# Patient Record
Sex: Female | Born: 1990 | Race: White | Marital: Single | Smoking: Never smoker
Health system: Northeastern US, Community
[De-identification: ages and names within clinical notes are randomized; demographics above are authoritative.]

## PROBLEM LIST (undated history)

## (undated) DIAGNOSIS — E282 Polycystic ovarian syndrome: Secondary | ICD-10-CM

## (undated) DIAGNOSIS — R51 Headache: Secondary | ICD-10-CM

## (undated) DIAGNOSIS — R519 Headache, unspecified: Secondary | ICD-10-CM

## (undated) DIAGNOSIS — J329 Chronic sinusitis, unspecified: Secondary | ICD-10-CM

## (undated) DIAGNOSIS — E739 Lactose intolerance, unspecified: Secondary | ICD-10-CM

## (undated) HISTORY — DX: Polycystic ovarian syndrome: E28.2

## (undated) HISTORY — DX: Headache, unspecified: R51.9

## (undated) HISTORY — DX: Lactose intolerance, unspecified: E73.9

## (undated) HISTORY — DX: Headache: R51

## (undated) HISTORY — PX: NO SIGNIFICANT SURGICAL HISTORY: 1000005

## (undated) HISTORY — DX: Chronic sinusitis, unspecified: J32.9

---

## 2017-07-20 ENCOUNTER — Encounter (HOSPITAL_BASED_OUTPATIENT_CLINIC_OR_DEPARTMENT_OTHER): Payer: Self-pay | Admitting: Family Medicine

## 2017-07-20 ENCOUNTER — Ambulatory Visit (HOSPITAL_BASED_OUTPATIENT_CLINIC_OR_DEPARTMENT_OTHER): Payer: Medicaid Other | Admitting: Family Medicine

## 2017-07-20 VITALS — BP 108/67 | HR 77 | Temp 98.2°F | Ht 65.75 in | Wt 112.0 lb

## 2017-07-20 DIAGNOSIS — E739 Lactose intolerance, unspecified: Secondary | ICD-10-CM | POA: Insufficient documentation

## 2017-07-20 DIAGNOSIS — K219 Gastro-esophageal reflux disease without esophagitis: Principal | ICD-10-CM | POA: Insufficient documentation

## 2017-07-20 DIAGNOSIS — Z7189 Other specified counseling: Secondary | ICD-10-CM

## 2017-07-20 DIAGNOSIS — J069 Acute upper respiratory infection, unspecified: Secondary | ICD-10-CM

## 2017-07-20 MED ORDER — RANITIDINE HCL 150 MG PO TABS
150.00 mg | ORAL_TABLET | Freq: Every evening | ORAL | 3 refills | Status: AC | PRN
Start: 2017-07-20 — End: 2018-07-20

## 2017-07-20 MED ORDER — RANITIDINE HCL 150 MG PO TABS: 150 mg | tablet | Freq: Every evening | ORAL | 3 refills | 0 days | Status: AC | PRN

## 2017-07-20 NOTE — Progress Notes (Signed)
[facility]  Family Medicine Office Visit Note   Patient presents with:  Establish Care  Referral: GI and GYN    Subjective:   Danielle Pittman is a 26 year old female patient who presents today for new pt visit.    Lactose intolerance x3 years. Needs a GI doctor. They were wondering in [place] if she had IBS and reflux.  Triggers: coffee, nuts, dried fruit, cookies, tomato, pineapple.  Removed dairy and had a plateau of weight.  Noting burning/heartburn and indigestion with a full sensation.  Hx of pantoprazole x3 months with relief in the past  Denies intractable nausea or vomiting and is able to tolerate PO  Denies weight loss (unintentional/unexplained)  Denies melena/hematochezia  Denies history of anemia  Denies family history of bowel/ovarian cancer/IBD/celiac     Wants to see routine OB/GYN for her routine physical. Found out that sister had abnormal pap smear. And wants to be checked  Hx of 2 pap smears in the past, both normal.    Past Medical History:  No date: Headache  No date: Lactose intolerance  No date: Sinusitis  Past Surgical History:  No date: NO SIGNIFICANT SURGICAL HISTORY    No current outpatient prescriptions on file prior to visit.  No current facility-administered medications on file prior to visit.   Review of Patient's Allergies indicates:  No Known Allergies    Family History    Cancer - Other Mother     Comment: skin cancer x3    Alcohol/Drug Abuse Father     Cancer - Other Other     Comment: great grandmother w/ skin, laryngeal cancer that metastasized to the lungs    Cancer - Breast FamHxNeg     Cancer - Colon FamHxNeg     Diabetes FamHxNeg         Social History  Social History   Marital status: Single  Spouse name: N/A    Years of education: N/A  Number of children: N/A     Occupational History  None on file     Social History Main Topics   Smoking status: Never Smoker    Smokeless tobacco: Never Used    Alcohol use No    Drug use: No    Sexual activity: Yes     Partners: Male    Birth control/ protection: Condom     Other Topics Concern   None on file     Social History Narrative    From [place]. Bachelor's in Nurse, children's. Moved to [place] in 2017, learning english, working as a Solicitor for a shop. Lives w/ sister. Enjoys netflix/walking around in the parks.    07/20/2017 Fiance is in [place]. Interested in joining him in two years      Review of Systems - ROS all systems reviewed and are otherwise negative.       Objective:   BP 108/67  Pulse 77  Temp 98.2 F (36.8 C) (Temporal)  Ht 5' 5.75" (1.67 m)  Wt 50.8 kg (112 lb)  LMP 07/10/2017  SpO2 98%  BMI 18.22 kg/m2  GA: [place] woman in no apparent distress   Skin: no evident rashes/lesions  HEENT: Normocephalic/atraumatic. Mucous membranes are moist. EOMI. Oropharynx clear.  Neck: supple, no lymphadenopathy. No thyromegaly.  Lung: clear to auscultation bilaterally, no crackles/wheezes/rhonchi  CV: regular rate and rhythm, no murmurs/rubs/gallops   ABD: soft, non-tender. normoactive bowel sounds. No organomegaly.   Extrem: warm, well perfused, 2+radial pulses. No cyanosis/clubbing/edema  Assessment & Plan:   Danielle Pittman is a 26 year old female    (K21.9) Gastroesophageal reflux disease, esophagitis presence not specified  (primary encounter diagnosis)  GERD diet discussed.  Pt interested in discontinuing PPI  Plan: raNITIdine (ZANTAC) 150 MG tablet Qhs prn severe sx.    (E73.9) Lactose intolerance  Comment: deferring GI referral today; reviewed red flags for GI sx as above as reasons to return to care.    (Z71.89) Advance care planning  Plan: HEALTH CARE PROXY    (J06.9) Upper respiratory tract infection, with headache  Comment: pt interested in using OTC saline sinus rinses for now.     I have reviewed the past medical, surgical, social and family history and updated these sections of EpicCare as relevant. All interim labs, test results, and consult notes were reviewed and discussed with Danielle Pittman. Medications were  reconciled during this visit and a current medication list was given to the patient at the end of the visit.    Danielle Pommier K. Kela MillinPong, MD  07/20/2017

## 2017-07-20 NOTE — Patient Instructions (Addendum)
Patient Education   Escolha de alimentos para a doena do refluxo gastroesofgico, adultos  (Food Choices for Gastroesophageal Reflux Disease, Adult)  Quando voc tem a doena do refluxo gastroesofgico (DRGE), os alimentos que ingere e seus hbitos alimentares so muito importantes. Escolher os alimentos certos pode ajudar a Lawyer.  QUAIS SO AS ORIENTAES GERAIS QUE DEVO SEGUIR?   Escolha frutas, vegetais, gros integrais, laticnios com baixo teor de gordura e carne, peixe e aves com pouca gordura.   Limite a ingesto de [employer], molhos para Gy[place], [place], nozes e abacate.   Faa um dirio alimentar para identificar os alimentos que causam os sintomas.   Evite alimentos que provoquem refluxo. Eles podem ser diferentes para pessoas distintas.   Faa refeies pequenas e frequentes em vez de trs grandes refeies ao dia.   Alimente-se com calma, em um ambiente tranquilo.   Limite os alimentos fritos.   Cozinhe os alimentos usando mtodos que no sejam a fritura.   Evite ingerir bebidas alcolicas.   Evite beber grandes quantidades de lquido com suas refeies.   Evite inclinar-se ou deitar at 2 a 3 horas aps comer.    QUAIS SO OS ALIMENTOS NO RECOMENDADOS?  Os seguintes alimentos e bebidas podem agravar os sintomas:  Legumes e verduras  Tomates. Suco de [place]. Molho de tomate e espaguete. Pimentas chili. [place] e alho. Raiz-forte.  Nils Pyle  Laranja, toranja e limo (fruta e suco).  Carnes  Carnes, peixes e aves com [place]. Elas incluem salsichas, costelas, presunto, salame e bacon.  Produtos lcteos  Leite integra e leite com chocolate. Creme de leite. Cremes. Manteiga. Sorvetes. Queijo cremoso.  Bebidas  Caf e ch, com ou sem cafeina. Bebidas carbonatadas ou bebidas energticas.  Condimentos  Molho apimentado. Molho barbecue.  Doces/Sobremesas  Chocolate e cacau. Donuts. Menta e hortel.  Gorduras e leos  Alimentos com alto teor de gordura,  incluindo batata frita e chips.  Outros  Vinagre. Temperos fortes como pimenta do reino, pimenta branca, pimenta vermelha, pimenta-caiena, curry, cravos, gengibre e chili em p.  Os itens listados acima podem no ser [facility] alimentos e bebidas a evitar. Entre em contato com seu nutricionista para mais informaes.  Estas informaes no se destinam a substituir as recomendaes de seu mdico. No deixe de discutir quaisquer dvidas com seu mdico.  Document Released: 11/02/2005 Document Revised: 11/23/2014 Document Reviewed: 09/06/2013  Elsevier Interactive Patient Education  2017 Elsevier Inc.       Patient Education   Escolha de alimentos para a doena do refluxo gastroesofgico, adultos  (Food Choices for Gastroesophageal Reflux Disease, Adult)  Careers adviser voc tem a doena do refluxo gastroesofgico (DRGE), os alimentos que ingere e seus hbitos alimentares so muito importantes. Escolher os alimentos certos pode ajudar a Lawyer.  QUAIS SO AS ORIENTAES GERAIS QUE DEVO SEGUIR?   Escolha frutas, vegetais, gros integrais, laticnios com baixo teor de gordura e carne, peixe e aves com pouca gordura.   Limite a ingesto de [employer], molhos para [place], [place], nozes e abacate.   Faa um dirio alimentar para identificar os alimentos que causam os sintomas.   Evite alimentos que provoquem refluxo. Eles podem ser diferentes para pessoas distintas.   Faa refeies pequenas e frequentes em vez de trs grandes refeies ao dia.   Alimente-se com calma, em um ambiente tranquilo.   Limite os alimentos fritos.   Cozinhe os alimentos usando mtodos que no sejam a  fritura.   Evite ingerir bebidas alcolicas.   Evite beber grandes quantidades de lquido com suas refeies.   Evite inclinar-se ou deitar at 2 a 3 horas aps comer.    QUAIS SO OS ALIMENTOS NO RECOMENDADOS?  Os seguintes alimentos e bebidas podem agravar os sintomas:  Legumes e  verduras  Tomates. Suco de [place]. Molho de tomate e espaguete. Pimentas chili. [place] e alho. Raiz-forte.  Nils PyleFrutas  Laranja, toranja e limo (fruta e suco).  Carnes  Carnes, peixes e aves com [place] gordura. Elas incluem salsichas, costelas, presunto, salame e bacon.  Produtos lcteos  Leite integra e leite com chocolate. Creme de leite. Cremes. Manteiga. Sorvetes. Queijo cremoso.  Bebidas  Caf e ch, com ou sem cafeina. Bebidas carbonatadas ou bebidas energticas.  Condimentos  Molho apimentado. Molho barbecue.  Doces/Sobremesas  Chocolate e cacau. Donuts. Menta e hortel.  Gorduras e leos  Alimentos com alto teor de gordura, incluindo batata frita e chips.  Outros  Vinagre. Temperos fortes como pimenta do reino, pimenta branca, pimenta vermelha, pimenta-caiena, curry, cravos, gengibre e chili em p.  Os itens listados acima podem no ser [place] lista completa dos alimentos e bebidas a evitar. Entre em contato com seu nutricionista para mais informaes.  Estas informaes no se destinam a substituir as recomendaes de seu mdico. No deixe de discutir quaisquer dvidas com seu mdico.  Document Released: 11/02/2005 Document Revised: 11/23/2014 Document Reviewed: 09/06/2013  Elsevier Interactive Patient Education  2017 [employer]

## 2017-07-21 ENCOUNTER — Encounter (HOSPITAL_BASED_OUTPATIENT_CLINIC_OR_DEPARTMENT_OTHER): Payer: Self-pay | Admitting: Family Medicine

## 2017-08-31 ENCOUNTER — Telehealth (HOSPITAL_BASED_OUTPATIENT_CLINIC_OR_DEPARTMENT_OTHER): Payer: Self-pay | Admitting: Family Medicine

## 2017-08-31 NOTE — Telephone Encounter (Signed)
-----   Message from Nedrow D. Lemus sent at 08/31/2017 12:08 PM EDT -----  Regarding: benefit review  Vaccine Benefit Analysis:  Needed for Tdap and HPV vaccine(s).  Is patient currently waiting in clinic? No - Appt Date: 10/18    Route to the nursing pool?  Yes

## 2017-08-31 NOTE — Progress Notes (Unsigned)
Betsy from [facility] called the Central Refill Department to complete a benefit analysis for the tdap, hpv Vaccine.    The vaccine is covered under the patients prescription coverage.    Please choose [id number] tdap (Prior Auth/Pharmacy)     Please choose [phone] hpv (Prior Auth/Pharmacy)

## 2017-09-02 ENCOUNTER — Encounter (HOSPITAL_BASED_OUTPATIENT_CLINIC_OR_DEPARTMENT_OTHER): Payer: Self-pay | Admitting: Family Medicine

## 2017-09-02 ENCOUNTER — Ambulatory Visit (HOSPITAL_BASED_OUTPATIENT_CLINIC_OR_DEPARTMENT_OTHER): Payer: Medicaid Other | Admitting: Family Medicine

## 2017-09-02 VITALS — BP 102/68 | HR 62 | Temp 99.0°F | Ht 63.09 in | Wt 108.0 lb

## 2017-09-02 DIAGNOSIS — Z86010 Personal history of colonic polyps: Secondary | ICD-10-CM

## 2017-09-02 DIAGNOSIS — Z23 Encounter for immunization: Secondary | ICD-10-CM

## 2017-09-02 DIAGNOSIS — E282 Polycystic ovarian syndrome: Secondary | ICD-10-CM | POA: Insufficient documentation

## 2017-09-02 DIAGNOSIS — R1013 Epigastric pain: Secondary | ICD-10-CM

## 2017-09-02 DIAGNOSIS — Z9889 Other specified postprocedural states: Secondary | ICD-10-CM

## 2017-09-02 DIAGNOSIS — K120 Recurrent oral aphthae: Principal | ICD-10-CM

## 2017-09-02 LAB — CBC WITH PLATELET
HEMATOCRIT: 40.3 % (ref 34.1–44.9)
HEMOGLOBIN: 13.5 g/dL (ref 11.2–15.7)
MEAN CORP HGB CONC: 33.5 g/dL (ref 31.0–37.0)
MEAN CORPUSCULAR HGB: 29 pg (ref 26.0–34.0)
MEAN CORPUSCULAR VOL: 86.5 fL (ref 80.0–100.0)
MEAN PLATELET VOLUME: 11.5 fL (ref 8.7–12.5)
PLATELET COUNT: 252 10*3/uL (ref 150–400)
RBC DISTRIBUTION WIDTH STD DEV: 41.1 fL (ref 35.1–46.3)
RBC DISTRIBUTION WIDTH: 13.2 % (ref 11.5–14.3)
RED BLOOD CELL COUNT: 4.66 M/uL (ref 3.90–5.20)
WHITE BLOOD CELL COUNT: 7.9 10*3/uL (ref 4.0–11.0)

## 2017-09-02 MED ORDER — BENZOCAINE 10 % MT GEL
OROMUCOSAL | 1 refills | Status: AC | PRN
Start: 2017-09-02 — End: 2018-03-03

## 2017-09-02 MED ORDER — BENZOCAINE 10 % MT GEL: tube | OROMUCOSAL | 1 refills | 0 days | Status: AC | PRN

## 2017-09-02 MED FILL — GARDASIL 9 INJ: 1 days supply | Qty: 1 | Fill #0 | Status: CP

## 2017-09-02 NOTE — Progress Notes (Signed)
[facility]  Family Medicine Office Visit Note   Patient presents with:  Physical    Subjective:   Danielle Pittman is a 26 year old female patient who presents today for PE  Here with her mother - 'not eating well, last week had sores in the mouth' hx of aphthous/canker sores Q2 weeks  Stomach upset with heartburn, halitosis, and bloating -- cut out dairy and cut out gas-producing foods but these sensations are continuing  Taking baking soda without much relief.  No meds.  (+)heavy nausea  No vomiting -- if she has milk or trigger foods. Worsening in the past three weeks  4# wt loss in the past month  Bloating all the time;  No mucus in stools.  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   Review of Patient's Allergies indicates:  No Known Allergies  Family History   Problem Relation Age of Onset    Cancer - Other Mother         skin cancer x3    Alcohol/Drug Abuse Father     Cancer - Other Other         great grandmother w/ skin, laryngeal cancer that metastasized to the lungs    Cancer - Breast FamHxNeg     Cancer - Colon FamHxNeg     Diabetes FamHxNeg       Social History     Socioeconomic History    Marital status: Single     Spouse name: Not on file    Number of children: Not on file    Years of education: Not on file    Highest education level: Not on file   Social Needs    Financial resource strain: Not on file    Food insecurity - worry: Not on file    Food  insecurity - inability: Not on file    Transportation needs - medical: Not on file    Transportation needs - non-medical: Not on file   Occupational History    Not on file   Tobacco Use    Smoking status: Never Smoker    Smokeless tobacco: Never Used   Substance and Sexual Activity    Alcohol use: No    Drug use: No    Sexual activity: Yes     Partners: Male     Birth control/protection: Condom   Other Topics Concern    Not on file   Social History Narrative    From [place]. Bachelor's in Nurse, children'sconomics. 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 Enjoys netflix/walking around in the parks.    07/20/2017 F[place] is in [place]. Interested in joining him in two years      Review of Systems - ROS all systems reviewed and are otherwise negative.         Objective:   BP 102/68 (Site:  RA, Position: Sitting, Cuff Size: Reg)  Pulse 62  Temp 99 F (37.2 C) (Temporal)  Ht 5' 3.09" (1.602 m)  Wt 49 kg (108 lb)  LMP 09/02/2017  SpO2 100%  BMI 19.08 kg/m2   Most Recent Weight Reading(s)  09/02/17 : 49 kg (108 lb)  07/20/17 : 50.8 kg (112 lb)  GA: Pleasant [place] woman in no apparent distress   Skin: no evident rashes/lesions  HEENT:. Normocephalic/atraumatic. Sclera white, conjunctiva pink.  PERRL. EOMI. Mucous membranes are moist.   Neck: supple  Lung: clear to auscultation bilaterally, no crackles/wheezes/rhonchi  CV: regular rate and rhythm, no murmurs/rubs/gallops   ABD: soft, mild/moderate tendemess in epigastric/RUQ region. normoactive bowel sounds. No organomegaly.  No rebound/guarding/negative Murphy's sign  Extrem: warm, well perfused, 2+radial pulses . No cyanosis/clubbing/edema          Assessment & Plan:   Danielle Pittman is a 26 year old female    (K12.0) Canker sore  Will prescribe oragel for canker sores  Plan: CBC WITH PLATELET, COLLECTION VENOUS BLOOD         VENIPUNCTURE, COMPREHENSIVE METABOLIC PANEL,         REFERRAL TO GASTROENTEROLOGY ( INT)    (R10.13)  Dyspepsia  R/o PUD/hpylori, schisto/strongy, IBD. Less likely gallbladder colic.  Given canker sores and hx of colonoscopy at early age   Plan: HELICOBACTER PYLORI STOOL AG, STRONGYLOIDES AB         IGG, SCHISTOSOMAL AB IGG, CBC WITH PLATELET,         CELIAC PANEL, COLLECTION VENOUS BLOOD         VENIPUNCTURE, COMPREHENSIVE METABOLIC PANEL,         REFERRAL TO GASTROENTEROLOGY ( INT)    (Z98.890,  Z86.010) History of colonoscopy with polypectomy  Plan: CBC WITH PLATELET, COMPREHENSIVE METABOLIC         PANEL, REFERRAL TO GASTROENTEROLOGY ( INT)    (E28.2) PCOS (polycystic ovarian syndrome)  Comment: discussed that her PCOS likely resolved, dx'ed by [place] as a teenager. Has normal periods normal BMI and normal hair pattern growth.    (Z23) Need for HPV vaccination  Plan: HPV-9 IM (PRIOR AUTH NEEDED)  (Z23) Need for prophylactic vaccination and inoculation against influenza  Plan: IMMUNIZATION ADMIN SINGLE, RN, PR INFLUENZA VAC      QUAD 3 YRS PLUS IM (PUBLIC)    I have reviewed the past medical, surgical, social and family history and updated these sections of EpicCare as relevant. All interim labs, test results, and consult notes were reviewed and discussed with Danielle Pittman. Medications were reconciled during this visit and a current medication list was given to the patient at the end of the visit.    Danielle Pittman K. Kela Millin, MD  09/02/2017

## 2017-09-02 NOTE — Progress Notes (Signed)
Influenza Vaccine Procedure  September 02, 2017    1. Has the patient received the information for the influenza vaccine? Yes    2. Does the patient have any of the following contraindications?  Allergy to eggs? No  Allergic reaction to previous influenza vaccines? No  Any other problems to previous influenza vaccines? No  Paralyzed by Guillain-Barre syndrome?  No  Current moderate or severe illness? No  Allergy to contact lens solution? No    3. The vaccine has been administered in the usual fashion.     Immunization information reviewed. Current VIS reviewed and given to patient/ guardian. Verbal assent obtained from patient/ guardian.  See immunization/Injection module or chart review for date of publication and additional information. Verbal assent obtained from patient/guardian. Comfort measures for possible side effects reviewed.

## 2017-09-03 LAB — CELIAC PANEL
DEAMIDATED GLIADIN PEPTIDE IGA: <0.2 U/mL (ref 0–14.9)
TISSUE TRANSGLUTAMINASE IgA: <0.5 U/mL (ref 0–14.9)

## 2017-09-04 LAB — COMPREHENSIVE METABOLIC PANEL
ALANINE AMINOTRANSFERASE: 23 U/L (ref 12–45)
ALBUMIN: 4.1 g/dL (ref 3.4–5.0)
ALKALINE PHOSPHATASE: 74 U/L (ref 45–117)
ANION GAP: 5 mmol/L (ref 5–15)
ASPARTATE AMINOTRANSFERASE: 19 U/L (ref 8–34)
BILIRUBIN TOTAL: 0.5 mg/dL (ref 0.2–1.0)
BUN (UREA NITROGEN): 13 mg/dL (ref 7–18)
CALCIUM: 8.9 mg/dL (ref 8.5–10.1)
CARBON DIOXIDE: 30 mmol/L (ref 21–32)
CHLORIDE: 106 mmol/L (ref 98–107)
CREATININE: 0.8 mg/dL (ref 0.4–1.2)
ESTIMATED GLOMERULAR FILT RATE: >60 mL/min (ref >60)
Glucose Random: 80 mg/dL (ref 74–160)
POTASSIUM: 4 mmol/L (ref 3.5–5.1)
SODIUM: 141 mmol/L (ref 136–145)
TOTAL PROTEIN: 7.5 g/dL (ref 6.4–8.2)

## 2017-09-04 LAB — STRONGYLOIDES AB IGG: STRONGYLOIDES AB IgG: 3.72 INDEX (ref 0.00–9.99)

## 2017-09-04 LAB — SCHISTOSOMAL AB IGG: SCHISTOSOMAL AB IgG: 12.85 INDEX — ABNORMAL HIGH (ref 0.00–8.99)

## 2017-09-06 ENCOUNTER — Telehealth (HOSPITAL_BASED_OUTPATIENT_CLINIC_OR_DEPARTMENT_OTHER): Payer: Self-pay | Admitting: Registered Nurse

## 2017-09-06 ENCOUNTER — Telehealth (HOSPITAL_BASED_OUTPATIENT_CLINIC_OR_DEPARTMENT_OTHER): Payer: Self-pay | Admitting: Family Medicine

## 2017-09-06 ENCOUNTER — Ambulatory Visit (HOSPITAL_BASED_OUTPATIENT_CLINIC_OR_DEPARTMENT_OTHER): Payer: Medicaid Other | Admitting: Family Medicine

## 2017-09-06 MED ORDER — PRAZIQUANTEL 600 MG PO TABS: 20 mg/kg | tablet | Freq: Three times a day (TID) | ORAL | 0 refills | 0 days | Status: AC

## 2017-09-06 MED ORDER — PRAZIQUANTEL 600 MG PO TABS
20.00 mg/kg | ORAL_TABLET | Freq: Three times a day (TID) | ORAL | 0 refills | Status: AC
Start: 2017-09-06 — End: 2017-09-07

## 2017-09-06 MED FILL — BILTRICIDE 600MG: 1 days supply | Qty: 5 | Fill #0 | Status: CP

## 2017-09-06 MED FILL — RANITIDINE 150MG TAB: 30 days supply | Qty: 30 | Fill #0 | Status: CP

## 2017-09-06 NOTE — Progress Notes (Signed)
Results phone-call:    Telephone Information:   Home Phone [phone]   Work Phone Not on file.   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  Awaiting stool H pylori testing as well -- she was waiting to see if it would be okay to stool at home and bring in the result tomorrow. I reassured her that it would be okay to do that.    Brief chart review:  Patient Active Problem List    PCOS (polycystic ovarian syndrome)      Gastroesophageal reflux disease         Date Noted: 07/20/2017      Lactose intolerance         Current Outpatient Medications on File Prior to Visit:  benzocaine (ORAJEL) 10 % mucosal gel Use as directed in the mouth or throat as needed for Pain Disp: 2 tube Rfl: 1   raNITIdine (ZANTAC) 150 MG tablet Take 1 tablet by mouth nightly as needed for Heartburn Disp: 90 tablet Rfl: 3     No current facility-administered medications on file prior to visit.    Review of Patient's Allergies indicates:  No Known Allergies   No results found for this visit on 09/06/17 (from the past 24 hour(s)).    Office Visit on 09/02/2017   Component Date Value Ref Range Status    STRONGYLOIDES AB IgG 09/02/2017 3.72  0.00 - 9.99 INDEX Final    Comment:  < = 9.99:  Negative      10.00:  Equivocal  > = 10.01:  Positive    If the result is equivocal, it is recommended to repeat the  test again 2-4 weeks. If results of the second test are  again equivocal, the sample has to be considered negative.    This ELISA uses the structural similarities existing between  S. stercoralis and S. ratti antigens to detect antibodies in  human sera.    The diagnostic specificity is defined as the probability of  the assay of scoring negative in the absence of the specific  analyte. It is 94 %.    The diagnostic sensitivity is defined as the probability of  the assay of scoring positive in the presence of the  specific analyte.  It is 88 %.    Potential causes of a false positive are Amebiasis,  Ascaridiosis, Cysticercosis, Fasciolosis,  Filariosis,  Cystic echinococcosis, Schistosomosis and Toxocarosis.      SCHISTOSOMAL AB IgG 09/02/2017 12.85* 0.00 - 8.99 INDEX Final    Comment:     < = 8.99:   Negative  9.00 - 11.00:   Equivocal     > = 11.01:   Positive    If the result is equivocal, it is recommended to repeat the  test again in 2 -4 weeks. If results of the second test are  again equivocal, the sample should be considered negative.    The diagnostic specificity is defined as the probability of  the assay of scoring negative in the absence of the specific  analyte. It is >95 %.    The diagnostic sensitivity is defined as the probability of  the assay of scoring positive in the presence of the  specific analyte. It is 87 %.      WHITE BLOOD CELL COUNT 09/02/2017 7.9  4.0 - 11.0 TH/uL Final    RED  BLOOD CELL COUNT 09/02/2017 4.66  3.90 - 5.20 M/uL Final    HEMOGLOBIN 09/02/2017 13.5  11.2 - 15.7 g/dL Final    HEMATOCRIT 16/08/9603 40.3  34.1 - 44.9 % Final    MEAN CORPUSCULAR VOL 09/02/2017 86.5  80.0 - 100.0 fL Final    MEAN CORPUSCULAR HGB 09/02/2017 29.0  26.0 - 34.0 pg Final    MEAN CORP HGB CONC 09/02/2017 33.5  31.0 - 37.0 g/dL Final    RBC DISTRIBUTION WIDTH STD DEV 09/02/2017 41.1  35.1 - 46.3 fL Final    RBC DISTRIBUTION WIDTH 09/02/2017 13.2  11.5 - 14.3 % Final    PLATELET COUNT 09/02/2017 252  150 - 400 TH/uL Final    MEAN PLATELET VOLUME 09/02/2017 11.5  8.7 - 12.5 fL Final    TISSUE TRANSGLUTAMINASE IgG 09/02/2017 TNP  0 - 14.9 U/mL Final    DEAMINATED GLIADIN PEPTIDE IgG 09/02/2017 TNP  0 - 14.9 U/mL Final    TISSUE TRANSGLUTAMINASE IgA 09/02/2017 < 0.5  0 - 14.9 U/mL Final    DEAMIDATED GLIADIN PEPTIDE IgA 09/02/2017 < 0.2  0 - 14.9 U/mL Final    SODIUM 09/02/2017 141  136 - 145 mmol/L Final    POTASSIUM 09/02/2017 4.0  3.5 - 5.1 mmol/L Final    CHLORIDE 09/02/2017 106  98 - 107 mmol/L Final    CARBON  DIOXIDE 09/02/2017 30  21 - 32 mmol/L Final    ANION GAP 09/02/2017 5  5 - 15 mmol/L Final    CALCIUM 09/02/2017 8.9  8.5 - 10.1 mg/dL Final    Glucose Random 09/02/2017 80  74 - 160 mg/dL Final    BUN (UREA NITROGEN) 09/02/2017 13  7 - 18 mg/dL Final    TOTAL PROTEIN 09/02/2017 7.5  6.4 - 8.2 g/dL Final    ALBUMIN 54/07/8118 4.1  3.4 - 5.0 g/dL Final    BILIRUBIN TOTAL 09/02/2017 0.5  0.2 - 1.0 mg/dL Final    ALKALINE PHOSPHATASE 09/02/2017 74  45 - 117 U/L Final    ASPARTATE AMINOTRANSFERASE 09/02/2017 19  8 - 34 U/L Final    CREATININE 09/02/2017 0.8  0.4 - 1.2 mg/dL Final    ESTIMATED GLOMERULAR FILT RATE 09/02/2017 > 60  >60 ML/MIN Final    Comment: The estimated GFR derived from the MDRD Study equation has  not been validated for use with the elderly (over 50 years  of age), pregnant women, patients with serious comorbid  conditions, or persons with extremes of body size, muscle  mass, or nutritional status. Application of the equation to  these patient groups may lead to errors in GFR estimation.      ALANINE AMINOTRANSFERASE 09/02/2017 23  12 - 45 U/L Final

## 2017-09-06 NOTE — Progress Notes (Signed)
Returned call and spoke to pt using portuguese interpreter.    Pt states that she spoke to her PCP earlier and PCP had answered her questions and she is all set now.

## 2017-09-06 NOTE — Telephone Encounter (Signed)
-----  Message from [place] sent at 09/06/2017  2:38 PM EDT -----  Regarding: Stool kit questions  Contact: [phone]  Nallely Yost [phone], 26 year old, female    Calls today:  Clinical Questions (NON-SICK CLINICAL QUESTIONS ONLY)      Specific nature of request pt states she was given a stool kit at her last visit and was not sure when she is supposed to collect the sample. Pt is requesting a call back from nurse to verify.  Return phone number [phone]  Person calling on behalf of patient: Patient (self)      Patient's language of care: [place]    Patient needs a [place] interpreter.    Patient's PCP: Lenoria Chime. Donnita [place], MD, MD

## 2017-09-07 ENCOUNTER — Ambulatory Visit (HOSPITAL_BASED_OUTPATIENT_CLINIC_OR_DEPARTMENT_OTHER): Payer: Medicaid Other

## 2017-09-07 DIAGNOSIS — R1013 Epigastric pain: Principal | ICD-10-CM

## 2017-09-07 NOTE — Progress Notes (Signed)
[name],[place], 09/07/2017, 7:51 PM

## 2017-09-08 LAB — HELICOBACTER PYLORI STOOL AG

## 2017-09-09 ENCOUNTER — Encounter (HOSPITAL_BASED_OUTPATIENT_CLINIC_OR_DEPARTMENT_OTHER): Payer: Self-pay | Admitting: Family Medicine

## 2017-09-27 ENCOUNTER — Ambulatory Visit (HOSPITAL_BASED_OUTPATIENT_CLINIC_OR_DEPARTMENT_OTHER): Payer: Medicaid Other | Admitting: Family Medicine

## 2017-09-27 ENCOUNTER — Encounter (HOSPITAL_BASED_OUTPATIENT_CLINIC_OR_DEPARTMENT_OTHER): Payer: Self-pay | Admitting: Family Medicine

## 2017-09-27 VITALS — BP 96/61 | HR 65 | Temp 98.0°F | Wt 109.8 lb

## 2017-09-27 DIAGNOSIS — Z124 Encounter for screening for malignant neoplasm of cervix: Principal | ICD-10-CM

## 2017-09-27 DIAGNOSIS — Z113 Encounter for screening for infections with a predominantly sexual mode of transmission: Secondary | ICD-10-CM

## 2017-09-27 NOTE — Progress Notes (Signed)
Clinic Note:    SUBJECTIVE:  Danielle Pittman is a 26 year old female with the following active problem list:    Patient Active Problem List:     Lactose intolerance     Gastroesophageal reflux disease     PCOS (polycystic ovarian syndrome)      CC: Danielle Pittman is a 26 year old female who presents for her PAP    1) PAP  - no hx of abnormal  - normal monthly menses  - no vaginal or urinary concerns  - no birth control, uses condoms, not interested in other method at this time    2) STD screen  - would like GC CT screening  - no sx but would like to be safe        Past Medical History:  No date: Headache  No date: Lactose intolerance  No date: PCOS (polycystic ovarian syndrome)      Comment:  tx'ed as a teenager  No date: Sinusitis    Past Surgical History:  No date: NO SIGNIFICANT SURGICAL HISTORY      Current Outpatient Medications on File Prior to Visit:  benzocaine (ORAJEL) 10 % mucosal gel Use as directed in the mouth or throat as needed for Pain Disp: 2 tube Rfl: 1   raNITIdine (ZANTAC) 150 MG tablet Take 1 tablet by mouth nightly as needed for Heartburn Disp: 90 tablet Rfl: 3     No current facility-administered medications on file prior to visit.     Review of Patient's Allergies indicates:  No Known Allergies    Family History   Problem Relation Age of Onset    Cancer - Other Mother         skin cancer x3    Alcohol/Drug Abuse Father     Cancer - Other Other         great grandmother w/ skin, laryngeal cancer that metastasized to the lungs    Cancer - Breast FamHxNeg     Cancer - Colon FamHxNeg     Diabetes FamHxNeg        Social History     Socioeconomic History    Marital status: Single     Spouse name: Not on file    Number of children: Not on file    Years of education: Not on file    Highest education level: Not on file   Social Needs    Financial resource strain: Not on file    Food insecurity - worry: Not on file    Food insecurity - inability: Not on file    Transportation needs -  medical: Not on file    Transportation needs - non-medical: Not on file   Occupational History    Not on file   Tobacco Use    Smoking status: Never Smoker    Smokeless tobacco: Never Used   Substance and Sexual Activity    Alcohol use: No    Drug use: No    Sexual activity: Yes     Partners: Male     Birth control/protection: Condom   Other Topics Concern    Not on file   Social History Narrative    From [place]. Bachelor's in Nurse, children'sconomics. 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 Enjoys netflix/walking around in the parks.    07/20/2017 [place] is in [place]. Interested in joining him in two years       OBJECTIVE:  BP 96/61  Pulse 65  Temp 98 F (36.7 C) (Temporal)  Wt 49.8 kg (109 lb 12.8 oz)  LMP 09/02/2017  SpO2 99%  BMI 19.39 kg/m2              Physical Exam   Constitutional: She appears well-developed and well-nourished. No distress.   Eyes: Conjunctivae are normal.   Cardiovascular: Normal rate.   Pulmonary/Chest: Effort normal.   Genitourinary:   Genitourinary Comments: PELVIC:  External Genitalia: normal architecture  Vagina: well rugated and no lesions  Vaginal Discharge: normal appearing  Pelvic supports: normal  Cervix: no lesions  Uterus: anteverted, normal size and non-tender  Adnexa: no masses, nodularity, tenderness   Neurological: She is alert.   Skin: Skin is warm and dry.   Psychiatric: She has a normal mood and affect. Her behavior is normal. Judgment and thought content normal.           A/P:      (Z12.4) Screening for cervical cancer  (primary encounter diagnosis)  Comment: normal GYN exam, no hx of abnormal  Plan:  - CYTOPATH, C/V, THIN LAYER      (Z11.3) Screen for STD (sexually transmitted disease)  Plan:  - AMPLIFIED GENPROBE CHLAM/GC               Return to clinic PRN with worsening, persistent or new sx.     1. The patient indicates understanding of these issues and agrees with the plan.  2.  The patient is given an After Visit Summary  sheet that lists all of their medications with directions, their allergies, orders placed during this encounter, immunization dates, and follow- up instructions.  3. I reviewed the patient's medical information and medical history   4.  I reconciled the patient's medication list and prepared and supplied needed refills.  5.  I have reviewed the past medical, family, and social history sections including the medications and allergies listed in the above medical record    AlbaniaBrittany Bertha Lokken, PA-C, 09/27/2017, 10:58 AM

## 2017-09-28 LAB — CHLAMYDIA GC NAAT
GENPROBE CHLAMYDIA: NEGATIVE
GENPROBE GC: NEGATIVE

## 2017-10-01 LAB — CYTOPATH, C/V, THIN LAYER

## 2017-10-08 ENCOUNTER — Encounter (HOSPITAL_BASED_OUTPATIENT_CLINIC_OR_DEPARTMENT_OTHER): Payer: Self-pay | Admitting: Family Medicine

## 2017-10-08 NOTE — Progress Notes (Signed)
Letter sent home with results.

## 2017-12-03 ENCOUNTER — Ambulatory Visit (HOSPITAL_BASED_OUTPATIENT_CLINIC_OR_DEPARTMENT_OTHER): Payer: Medicaid Other | Admitting: Gastroenterology

## 2021-01-20 IMAGING — MR RM - PUNHO - ESQUERDO
4 of 7 series · 19 of 40 positions shown · non-contrast
Comparison: none

[Series 6: T1 · coronal · 2.5mm · 0.27mm/px · 5 of 16 slices shown (1 of 2)]
[im 1/16]
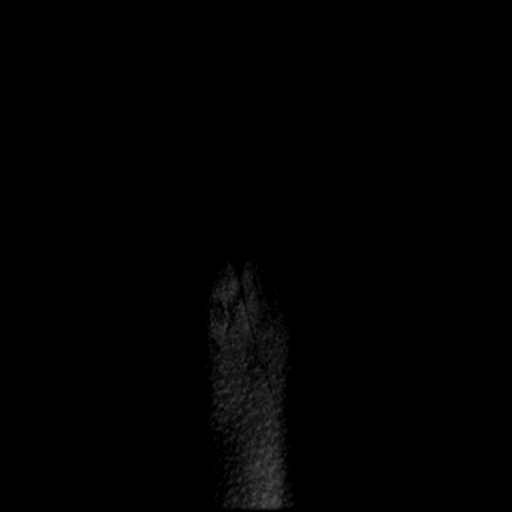
[im 4/16]
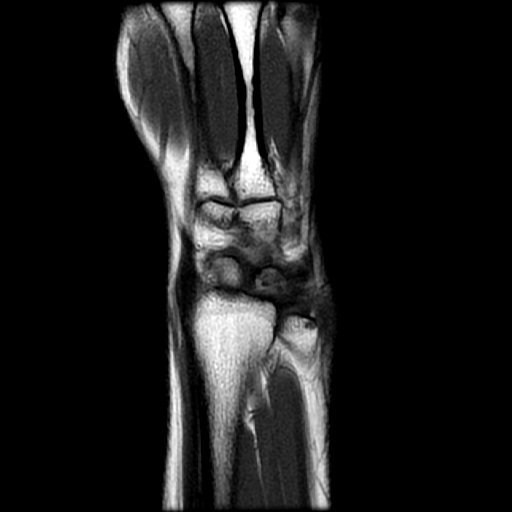
[im 8/16]
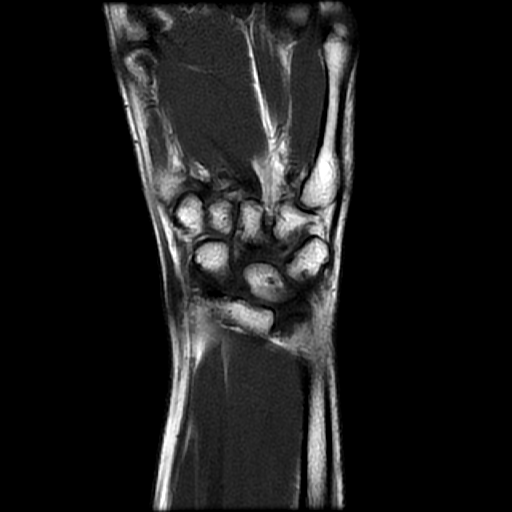
[im 12/16]
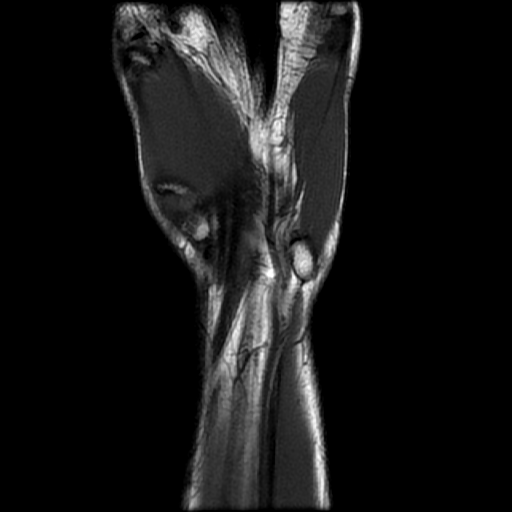
[im 16/16]
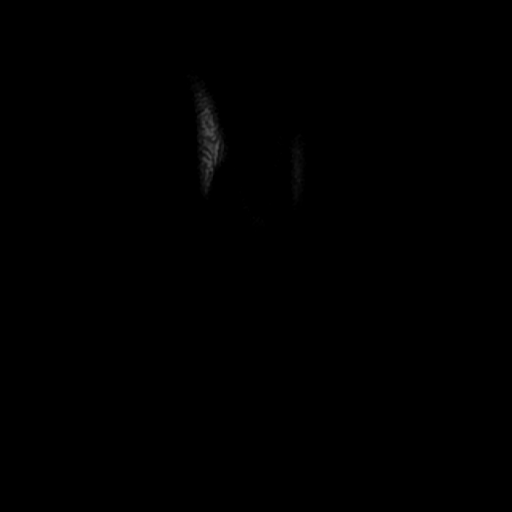

[Series 9: T1 · oblique · 2.3mm · 0.21mm/px · 7 of 25 slices shown (2 of 2)]
[im 1/25]
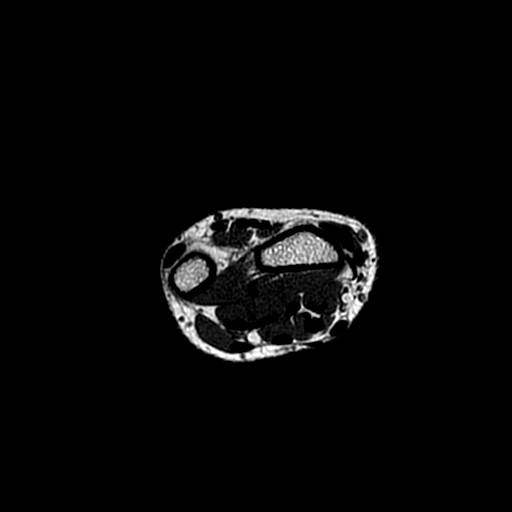
[im 5/25]
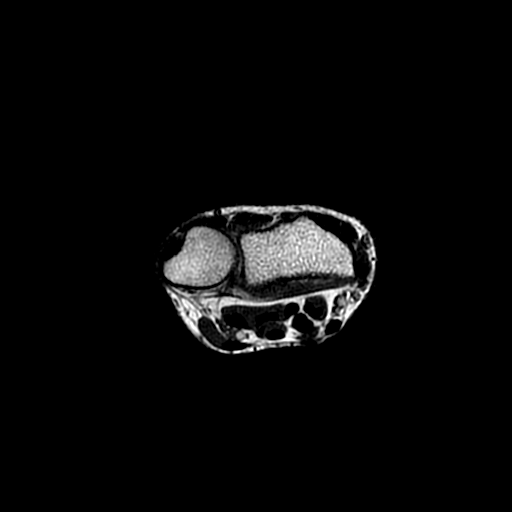
[im 9/25]
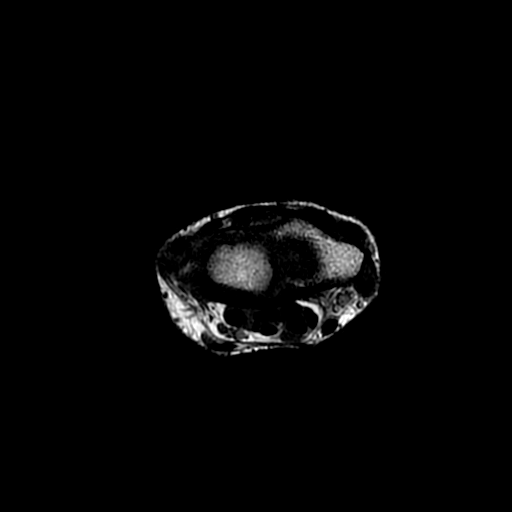
[im 13/25]
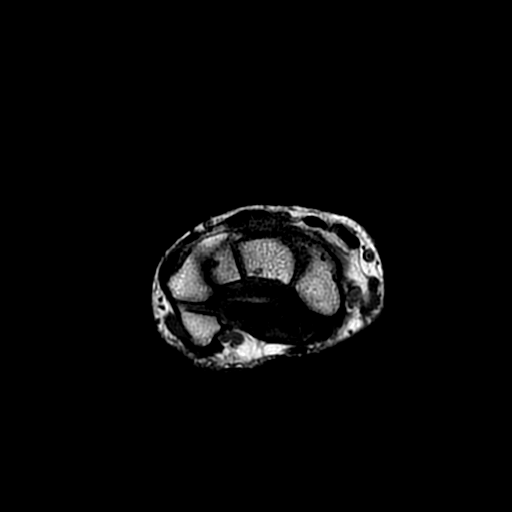
[im 17/25]
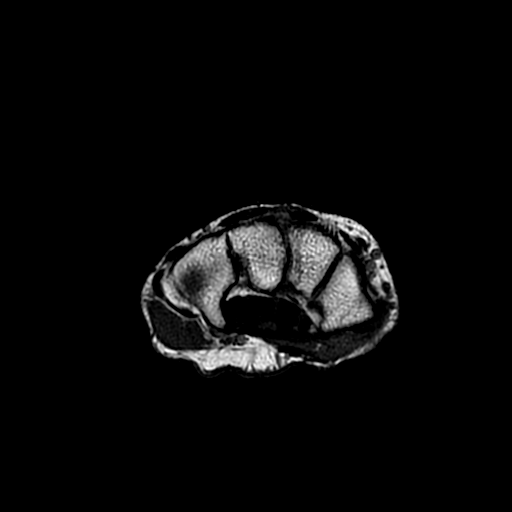
[im 21/25]
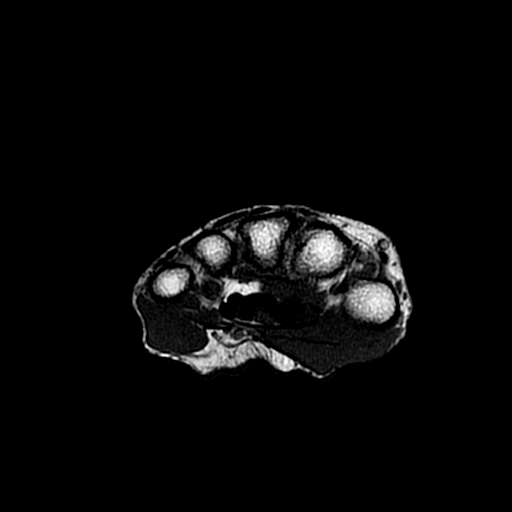
[im 25/25]
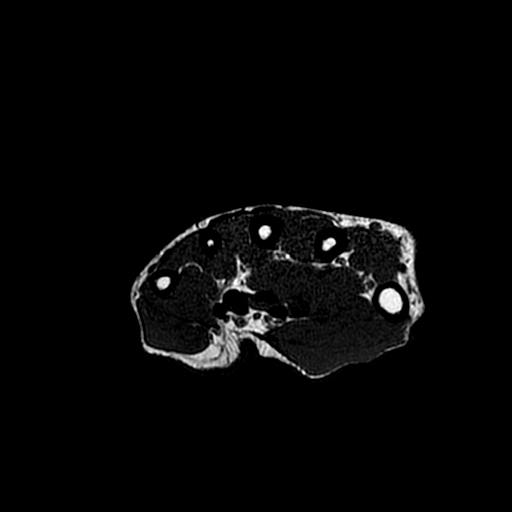

[Series 10: T1 fat-sat post-contrast · oblique · 2.3mm · 0.21mm/px · 4 of 25 slices shown (1 of 2)]
[im 1/25]
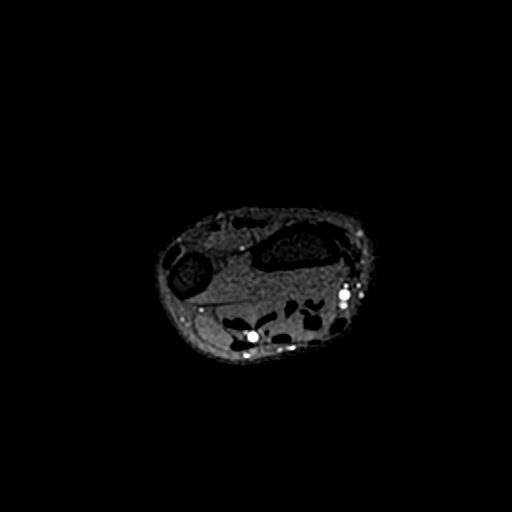
[im 5/25]
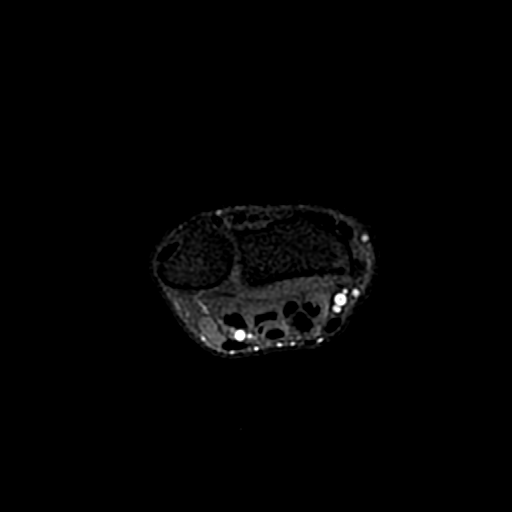
[im 13/25]
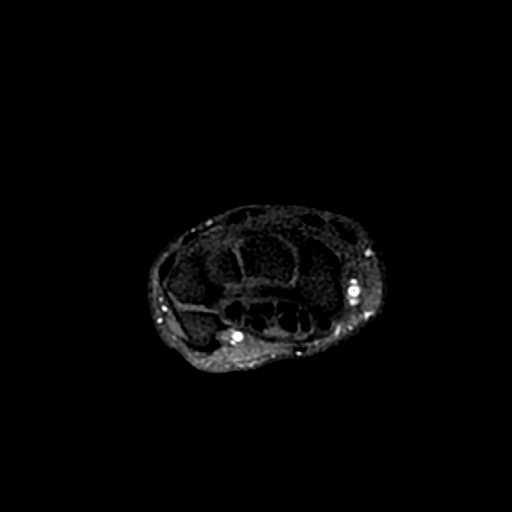
[im 21/25]
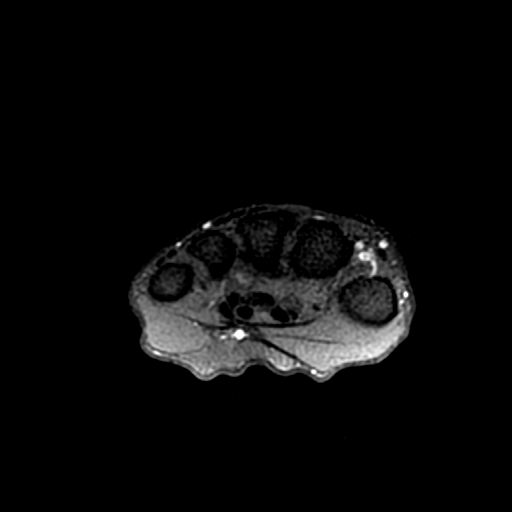

[Series 11: T1 fat-sat post-contrast · coronal · 2.5mm · 0.27mm/px · 3 of 16 slices shown (2 of 2)]
[im 1/16]
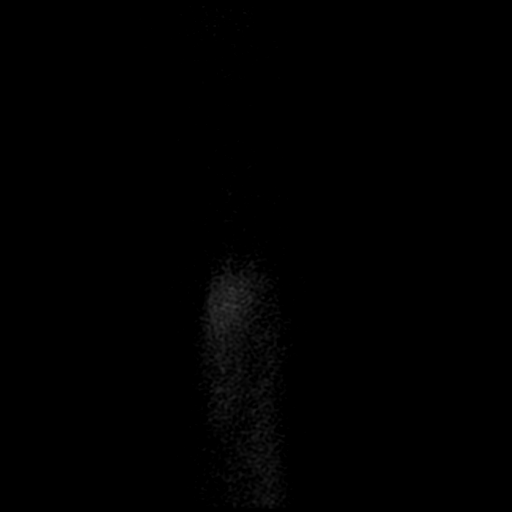
[im 8/16]
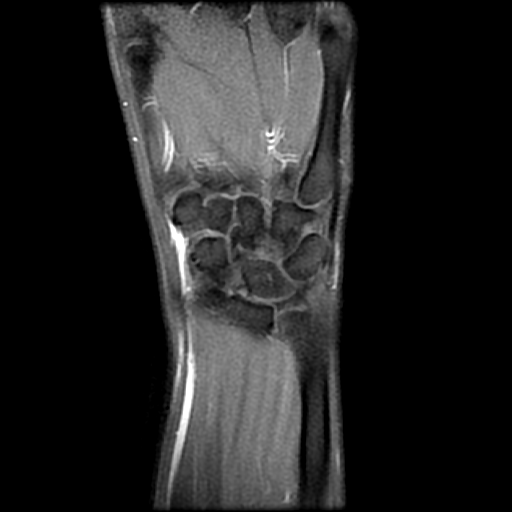
[im 16/16]
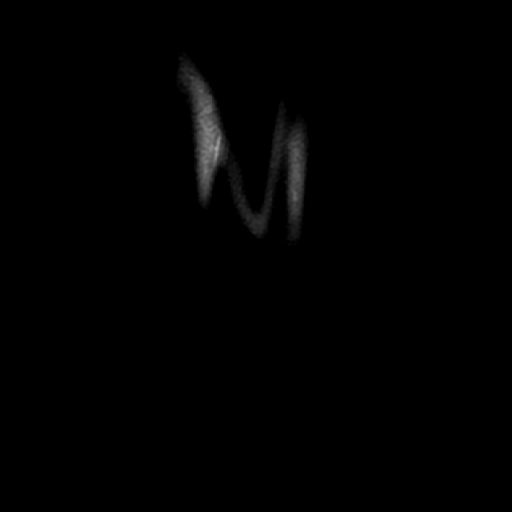

[19 of 40 positions shown; findings below may reference images not displayed]

TÉCNICA DE EXAME:
Sequências FSE com cortes multiplanares.
ANÁLISE:
Sinais sugestivos de perfuração na porção central do complexo da fibrocartilagem triangular.
Estruturas ósseas com morfologia e sinal preservados.
RESSONÂNCIA MAGNÉTICA DO PUNHO ESQUERDO
Estruturas musculares e tendíneas íntegras.
Ligamentos intrínsecos e extrínsecos do carpo de aspecto preservado.
Superfícies condrais regulares.
Ausência de derrame articular de volume significativo.
Feixes neurovasculares sem alterações significativas ao método.
Demais estruturas periarticulares sem alterações ao método.
IMPRESSÃO:
Sinais sugestivos de perfuração na porção central do complexo da fibrocartilagem triangular.
  Se  você  não  for  destinatário,  saiba  que  qualquer  divulgação,  cópia,  distribuição  ou  utilização  do  conteúdo  dessas 
informações é proibido e passível de punição dentro da lei.

## 2021-01-20 IMAGING — MR RM - COTOVELO - ESQUERDO
4 of 5 series · 19 of 40 positions shown · non-contrast
Comparison: none

[Series 4: T2 fat-sat · sagittal · 3.5mm · 0.27mm/px · 6 of 16 slices shown (1 of 3)]
[im 1/16]
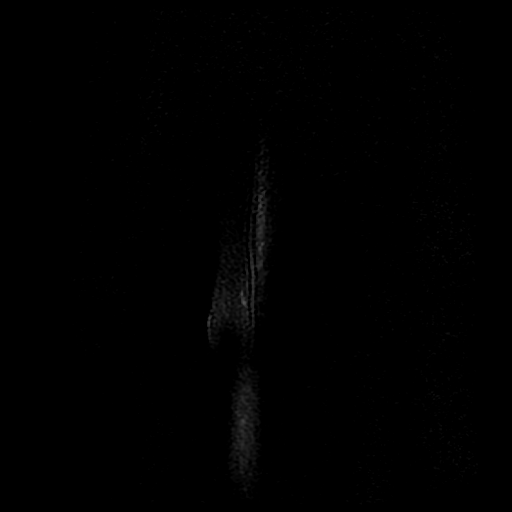
[im 4/16]
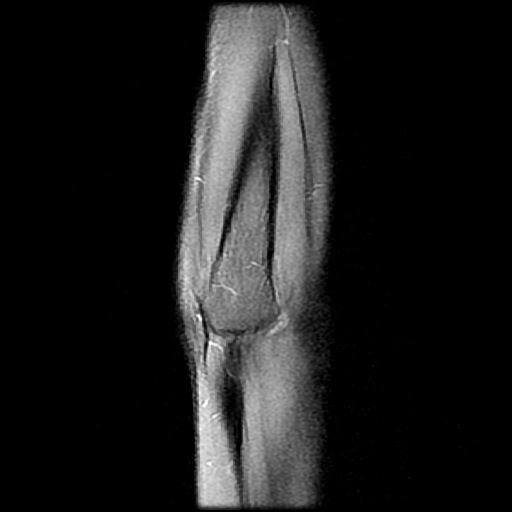
[im 7/16]
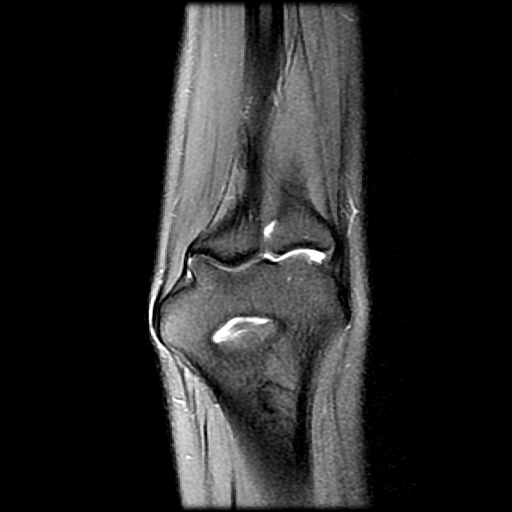
[im 10/16]
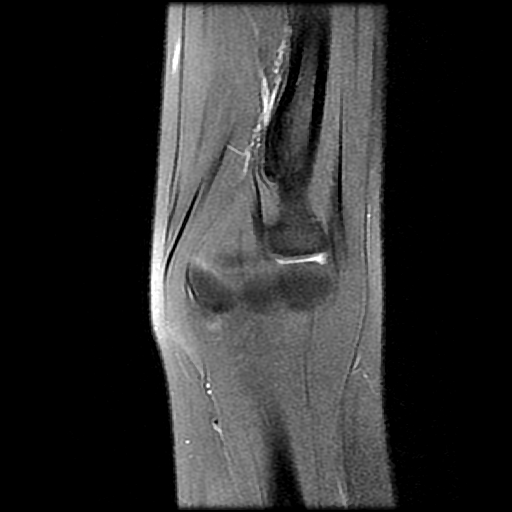
[im 13/16]
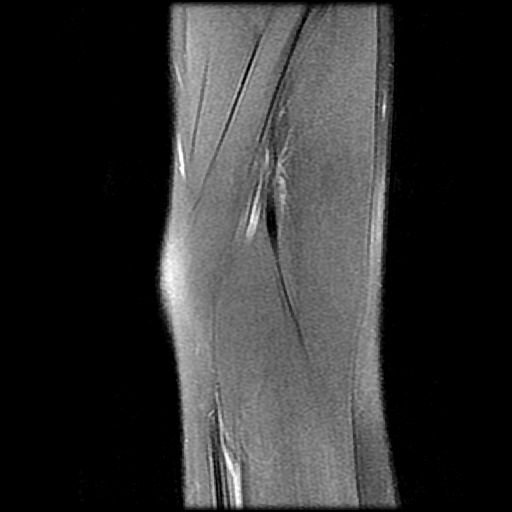
[im 16/16]
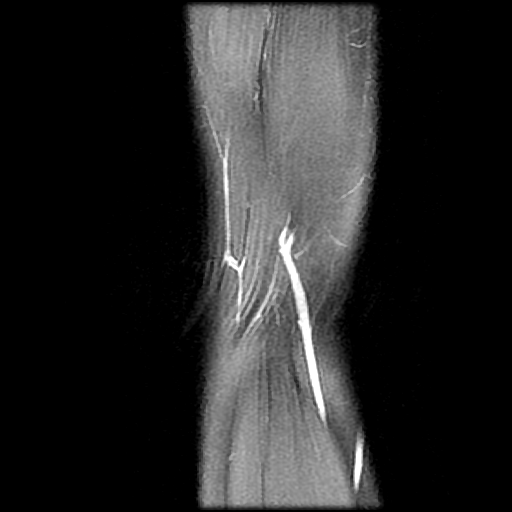

[Series 5: T1 · sagittal · 3.5mm · 0.27mm/px · 3 of 16 slices shown]
[im 3/16]
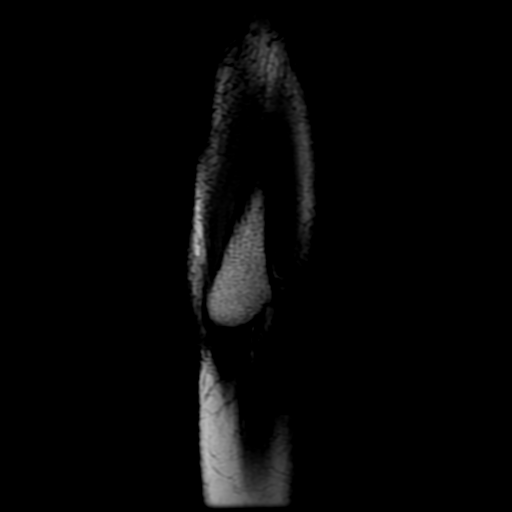
[im 8/16]
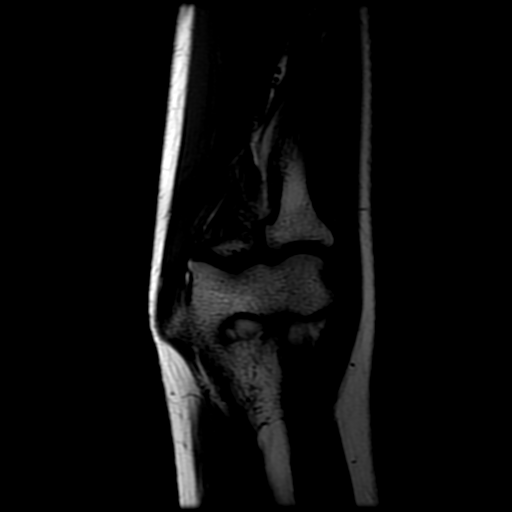
[im 13/16]
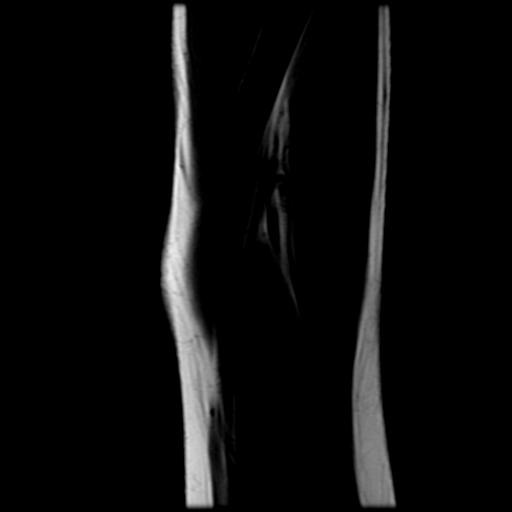

[Series 6: T2 fat-sat · axial · 3.5mm · 0.29mm/px · z∈[-51,+28]mm · 7 of 25 slices shown (2 of 3)]
[im 1/25]
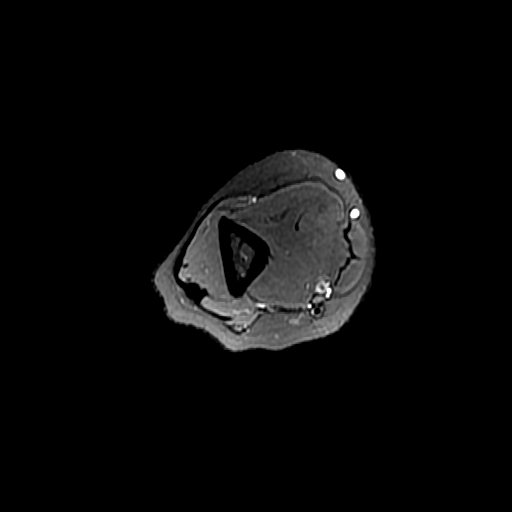
[im 3/25]
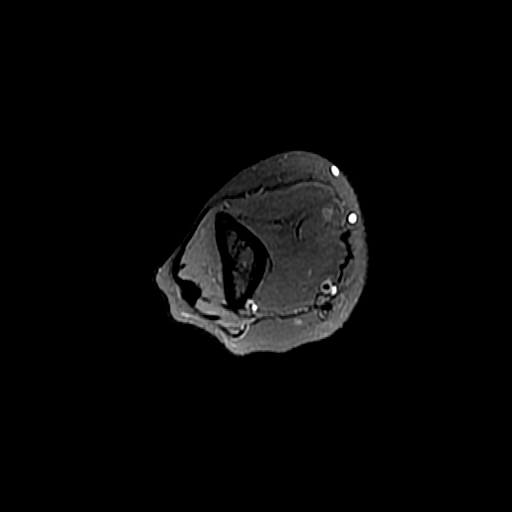
[im 9/25]
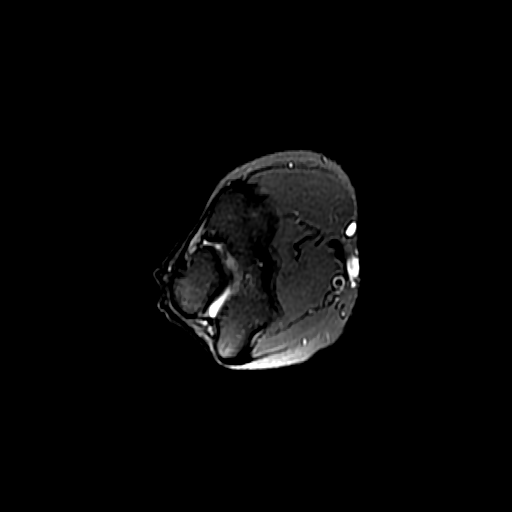
[im 11/25]
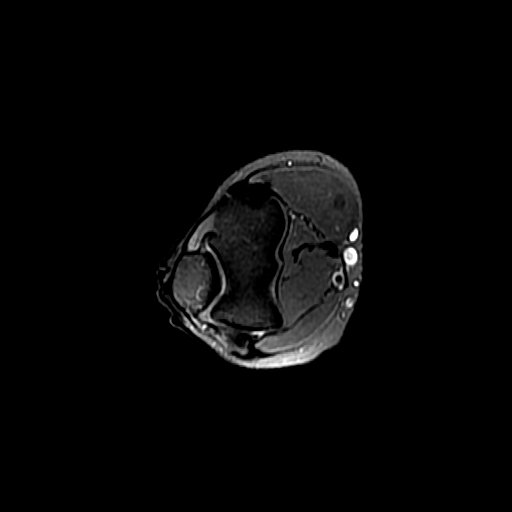
[im 14/25]
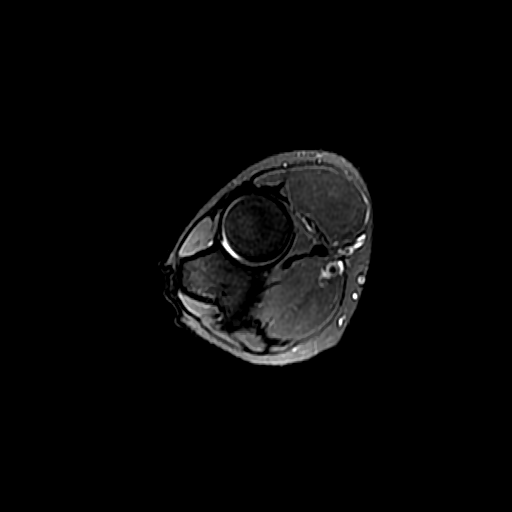
[im 17/25]
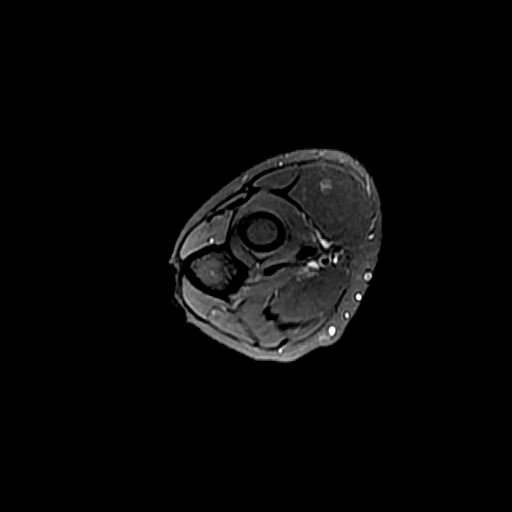
[im 22/25]
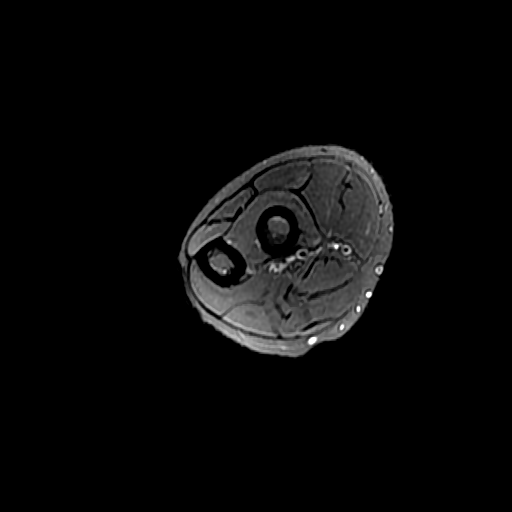

[Series 8: T2 fat-sat · coronal · 3.5mm · 0.29mm/px · 3 of 16 slices shown (3 of 3)]
[im 3/16]
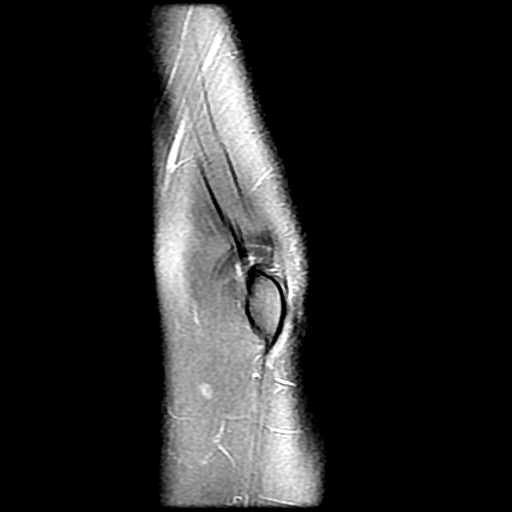
[im 8/16]
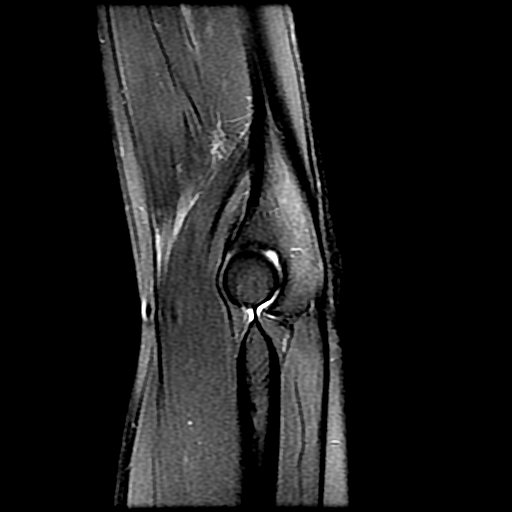
[im 13/16]
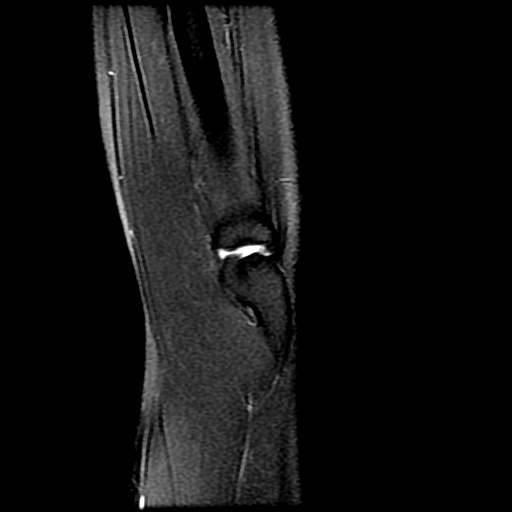

[19 of 40 positions shown; findings below may reference images not displayed]

TÉCNICA DE EXAME:
Sequências FSE com cortes multiplanares.
ANÁLISE:
Estruturas ósseas com morfologia e sinal preservados.
RESSONÂNCIA MAGNÉTICA DO COTOVELO ESQUERDO
Estruturas musculares e tendíneas íntegras.
Ligamentos sem rotura evidente.
Superfícies condrais regulares.
Ausência de derrame articular de volume significativo.
Feixes neurovasculares sem alterações ao método.
Demais estruturas periarticulares sem alterações ao método.
IMPRESSÃO:
Exame sem evidência de alterações significativas.
  Se  você  não  for  destinatário,  saiba  que  qualquer  divulgação,  cópia,  distribuição  ou  utilização  do  conteúdo  dessas 
informações é proibido e passível de punição dentro da lei.

## 2021-01-27 IMAGING — MR RM - MAO (NAO INCLUI PUNHO) - DIREITA
4 of 7 series · 19 of 40 positions shown · IV contrast (YS)
Comparison: none

[Series 5: T1 · coronal · 2.0mm · 0.39mm/px · 3 of 16 slices shown (1 of 2)]
[im 1/16]
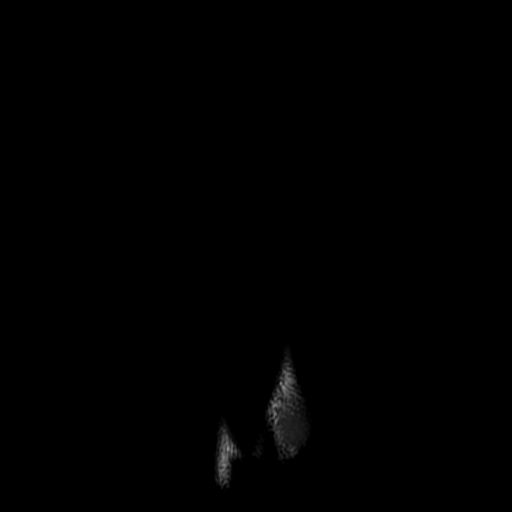
[im 8/16]
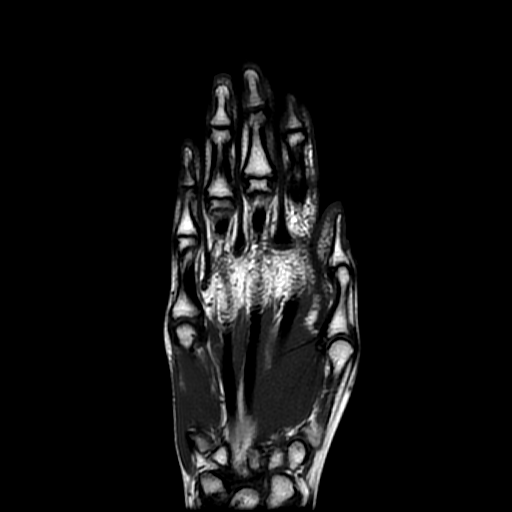
[im 16/16]
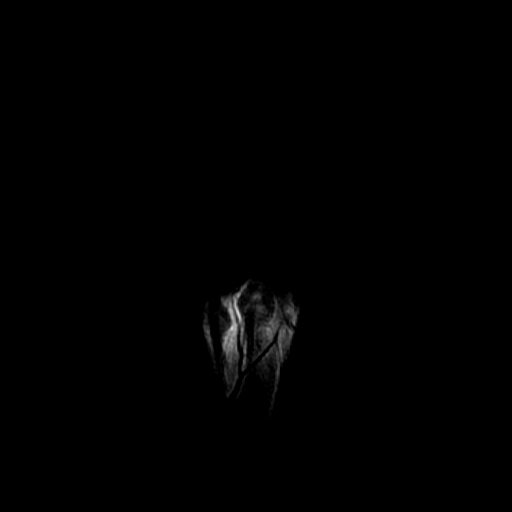

[Series 8: T1 · axial · 2.8mm · 0.31mm/px · z∈[-103,+53]mm · 9 of 52 slices shown (2 of 2)]
[im 1/52]
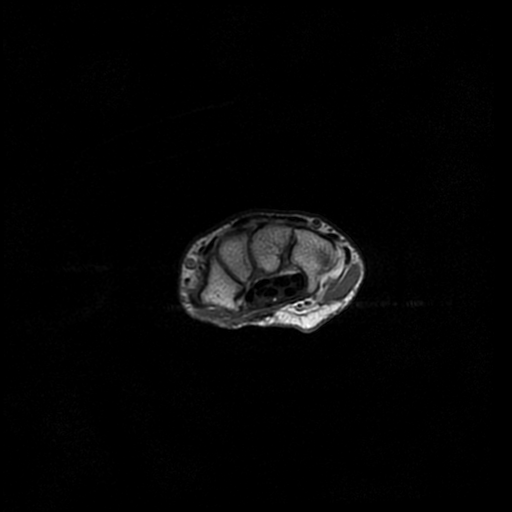
[im 7/52]
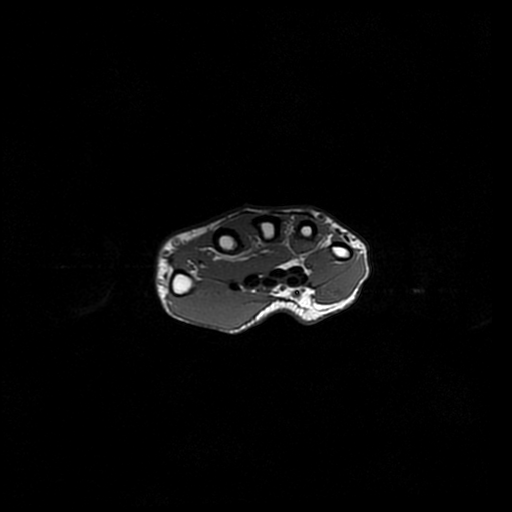
[im 13/52]
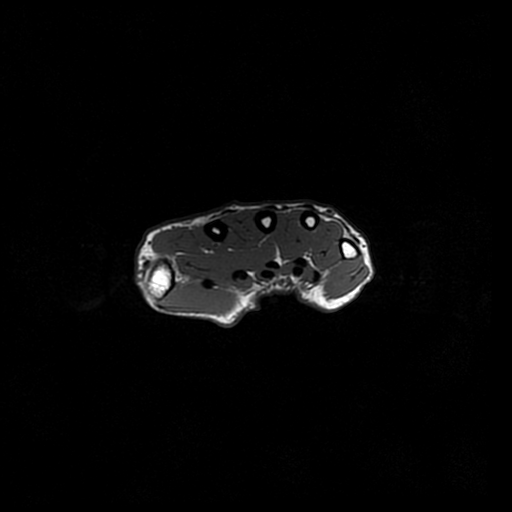
[im 20/52]
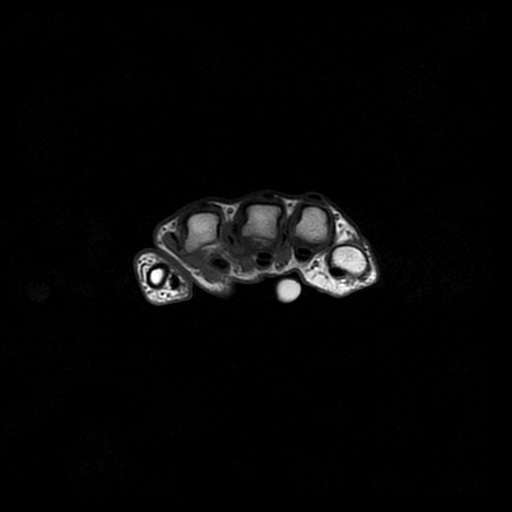
[im 26/52]
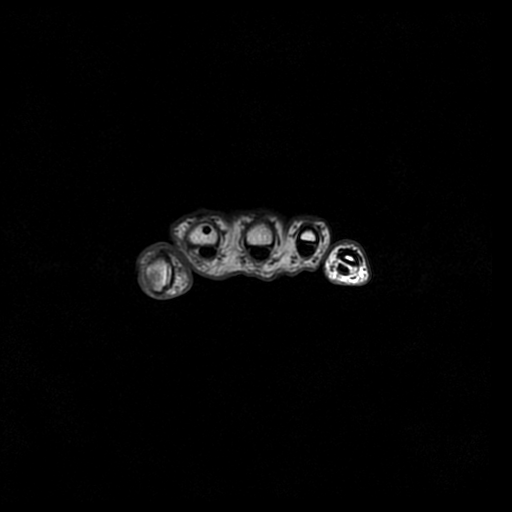
[im 32/52]
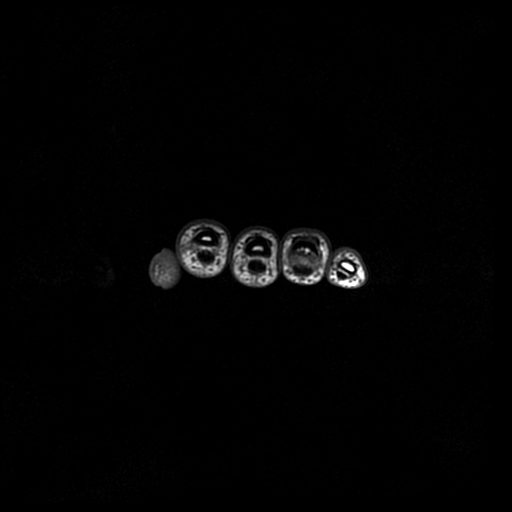
[im 39/52]
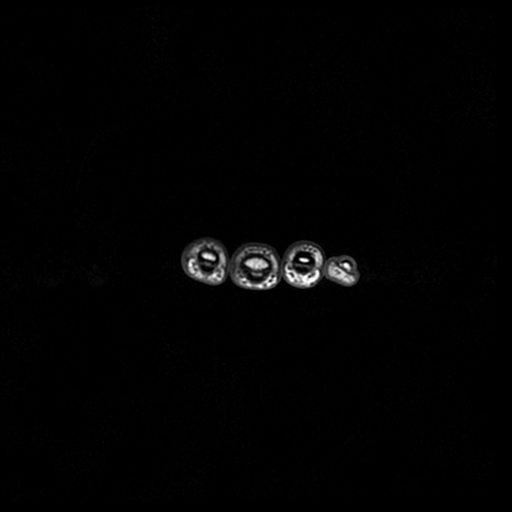
[im 45/52]
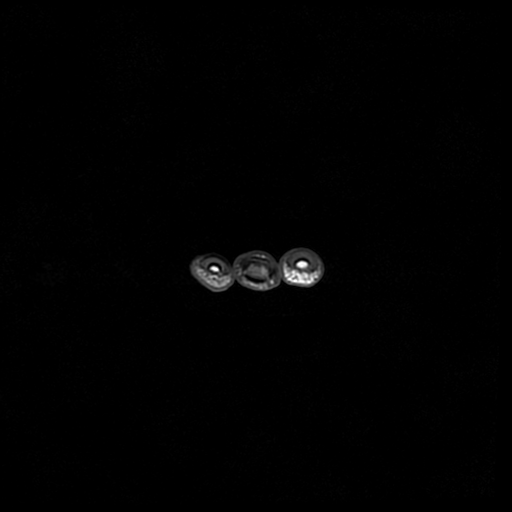
[im 52/52]
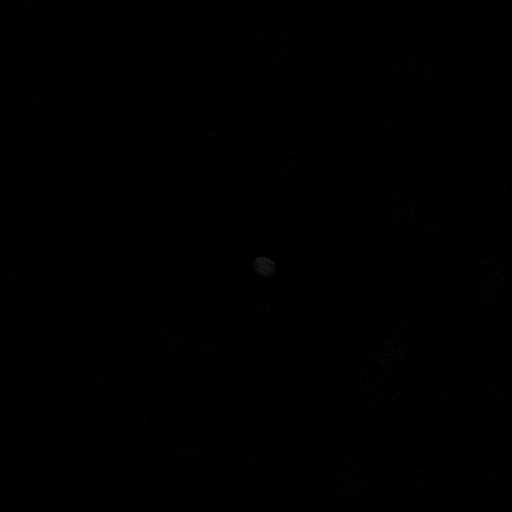

[Series 9: T1 fat-sat · axial · 2.8mm · 0.31mm/px · z∈[-103,+32]mm · 4 of 52 slices shown (1 of 2)]
[im 1/52]
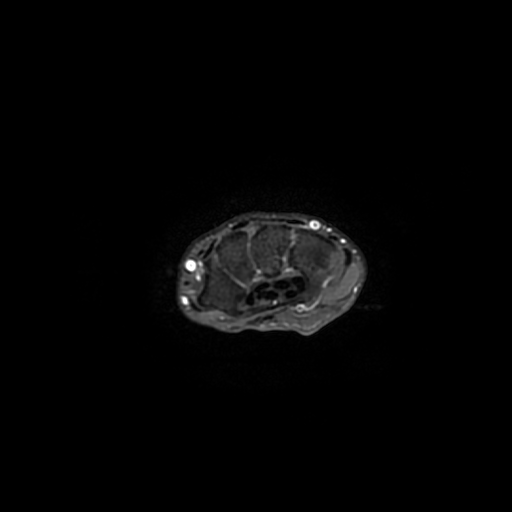
[im 7/52]
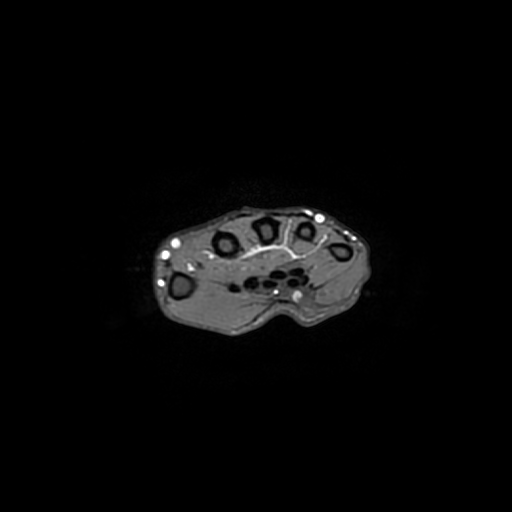
[im 26/52]
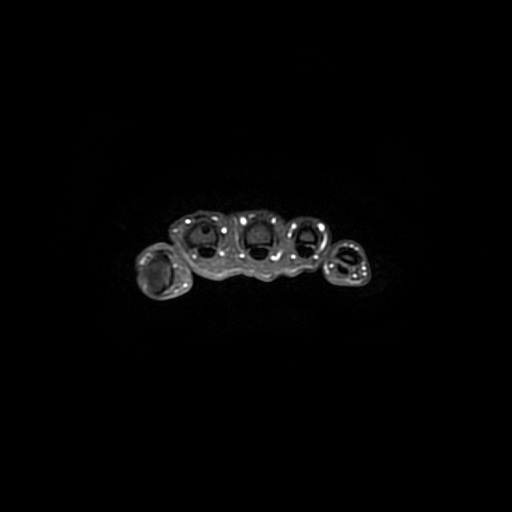
[im 45/52]
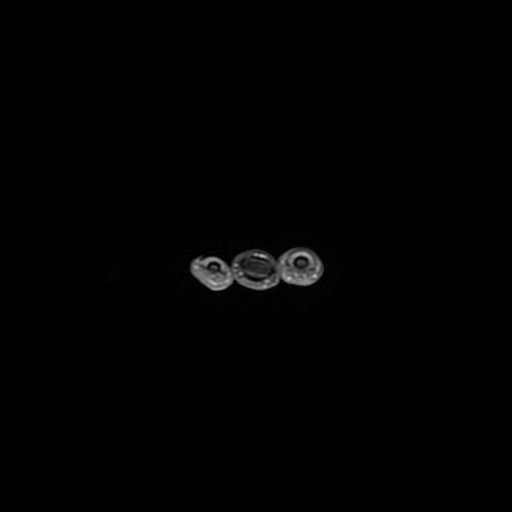

[Series 10: T1 fat-sat · coronal · 2.0mm · 0.39mm/px · 3 of 16 slices shown (2 of 2)]
[im 1/16]
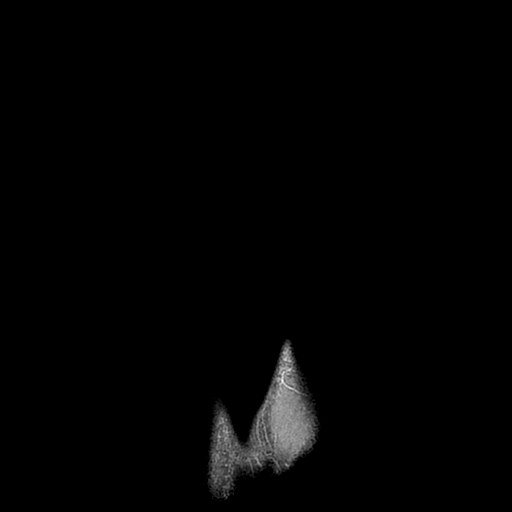
[im 8/16]
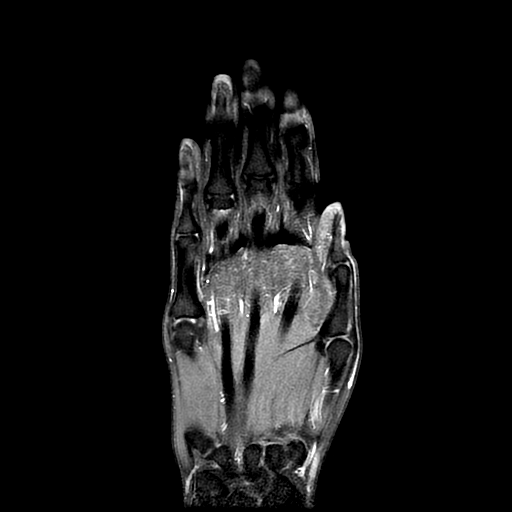
[im 16/16]
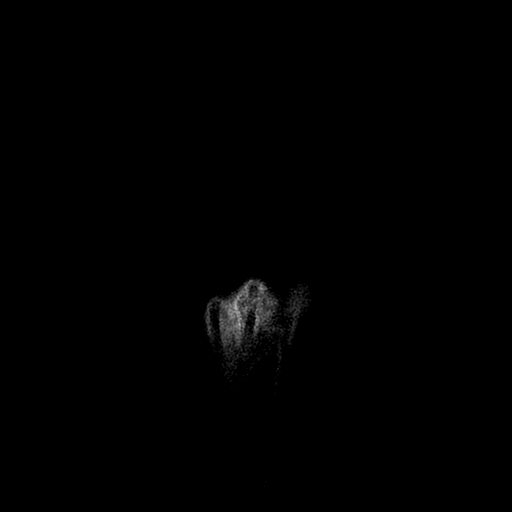

[19 of 40 positions shown; findings below may reference images not displayed]

METODOLOGIA: 
Exame  realizado  com  sequências  ponderadas  em  T1  e  T2,  em  aquisições  multiplanares,  com  a 
administração do meio de contraste paramagnético por via endovenosa. 
ANÁLISE: 
Estruturas ósseas com morfologia e sinal medular conservados. Não há erosões corticais. 
Superfícies condrais de contornos regulares. 
RESSONÂNCIA MAGNÉTICA DA MÃO DIREITA
Ausência de derrame articular. 
Complexos ligamentares íntegros. 
Tendões flexores e extensores de continuidade, espessura e sinal preservados. 
Planos musculares anatômicos, sem evidências de lesões. 
Não há evidência de formações nodulares sólidas ou císticas no presente estudo. 
Ausência de realces anômalos ao meio de contraste endovenoso e/ou sinais de sinovite.
IMPRESSÃO:
Exame dentro dos padrões da normalidade. 
  Se  você  não  for  destinatário,  saiba  que  qualquer  divulgação,  cópia,  distribuição  ou  utilização  do  conteúdo  dessas 
informações é proibido e passível de punição dentro da lei.

## 2022-02-10 IMAGING — MR RM - PELVE (NAO INCLUI [facility]ITAL[facility])
10 of 18 series · 21 of 48 positions shown · non-contrast
Comparison: none

[Series 3: T2 · sagittal · 5.0mm · 0.47mm/px · 2 of 24 slices shown (1 of 2)]
[im 1/24]
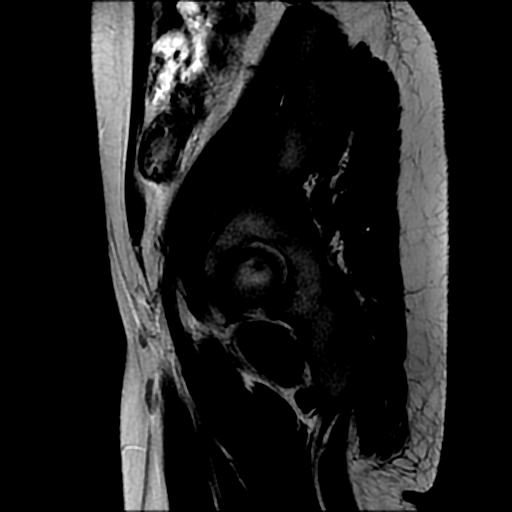
[im 24/24]
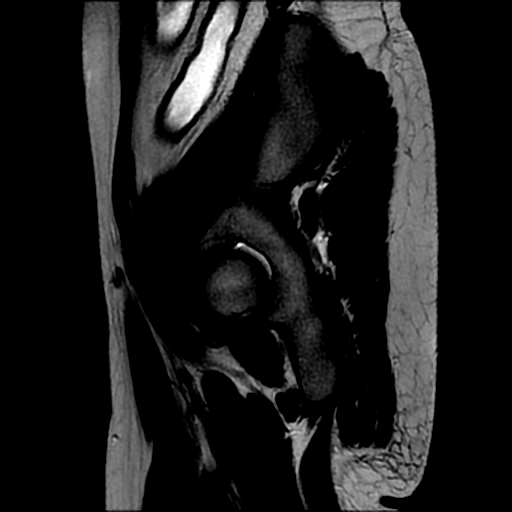

[Series 5: T2 · axial · 5.0mm · 0.51mm/px · 1 of 36 slices shown (2 of 2)]
[im 1/36]
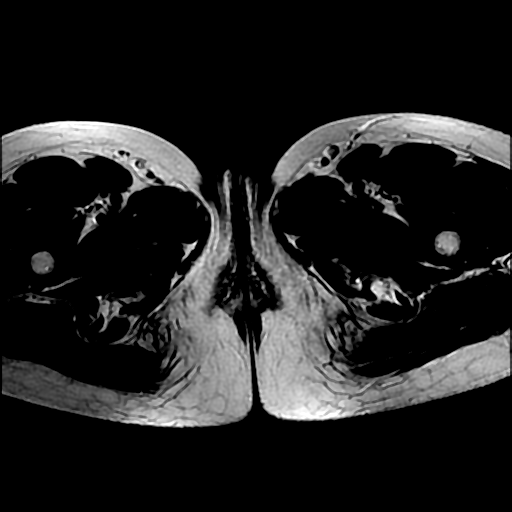

[Series 6: ax difusão · axial · 5.0mm · 1.02mm/px · z∈[+74,+266]mm · 2 of 72 slices shown]
[im 1/72]
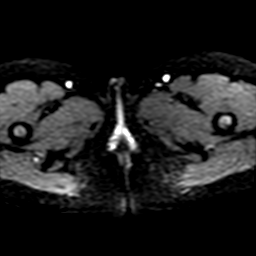
[im 72/72]
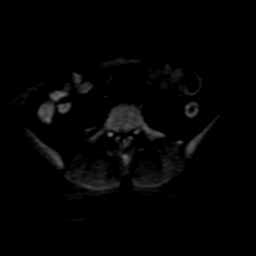

[Series 650: ADC · axial · 5.0mm · 1.02mm/px · 1 of 36 slices shown]
[im 1/36]
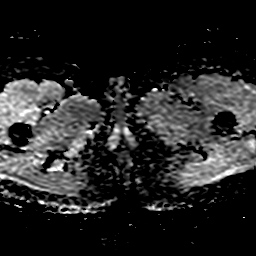

[Series 651: eadc:(date) (date) -03 · axial · 5.0mm · 1.02mm/px · 1 of 36 slices shown]
[im 1/36]
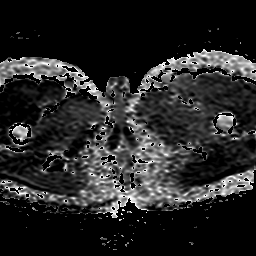

[Series 800: T1 dynamic · sagittal · 4.4mm · 0.55mm/px · 3 of 84 slices shown (1 of 4)]
[im 1/84]
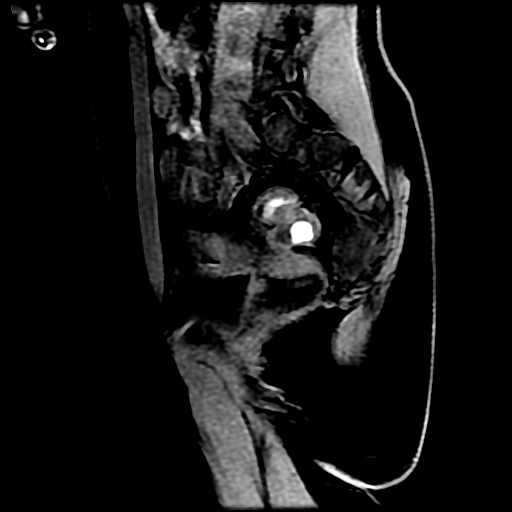
[im 42/84]
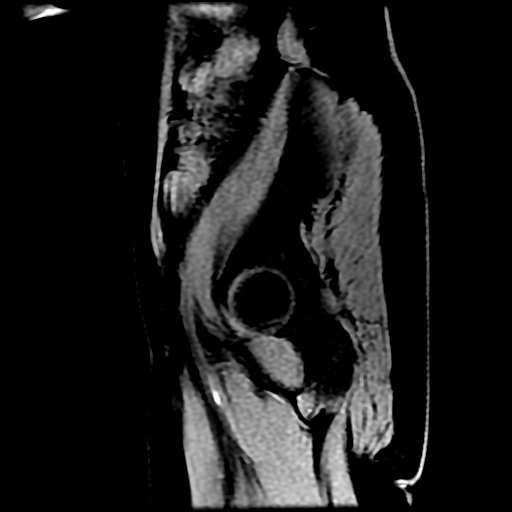
[im 84/84]
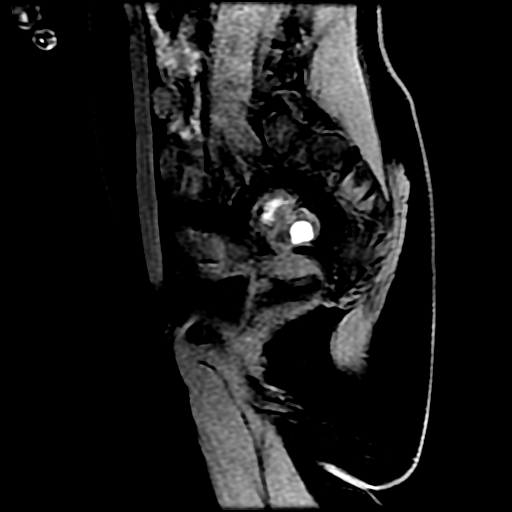

[Series 801: T1 dynamic · sagittal · 4.4mm · 0.55mm/px · 3 of 84 slices shown (2 of 4)]
[im 1/84]
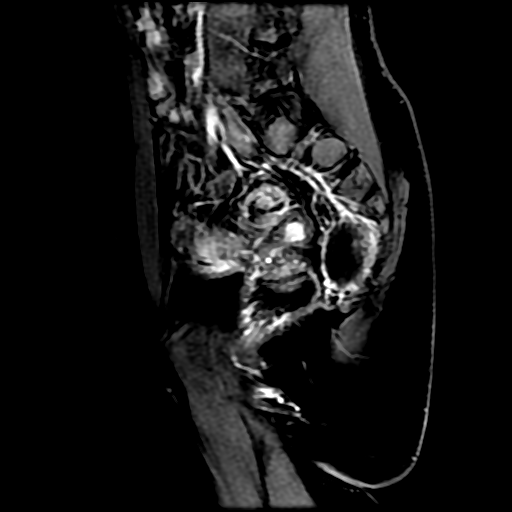
[im 42/84]
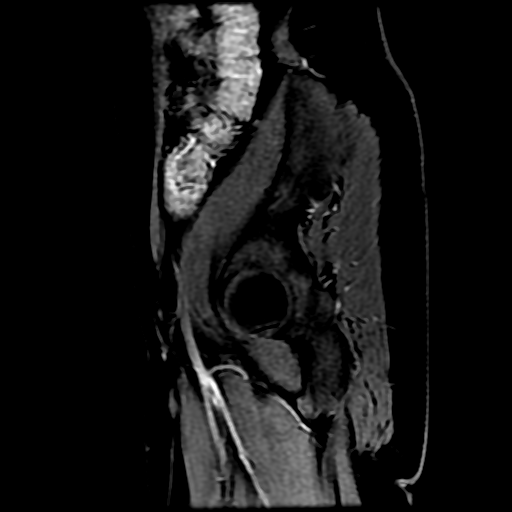
[im 84/84]
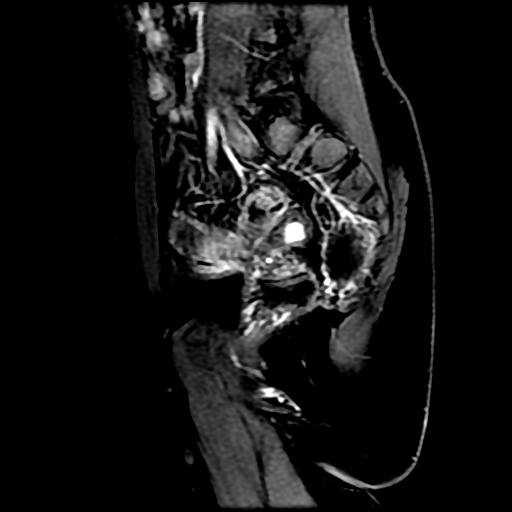

[Series 802: T1 dynamic · sagittal · 4.4mm · 0.55mm/px · 3 of 84 slices shown (3 of 4)]
[im 1/84]
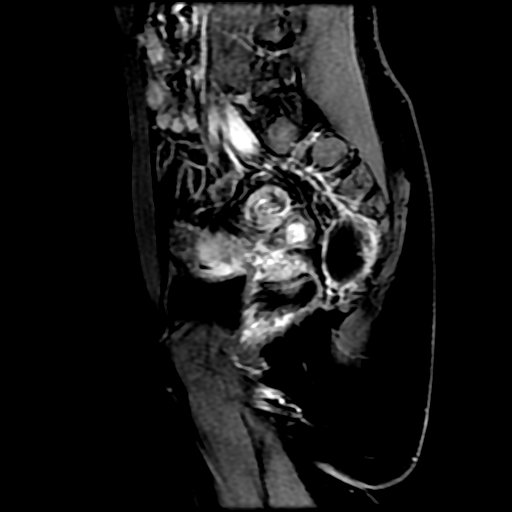
[im 42/84]
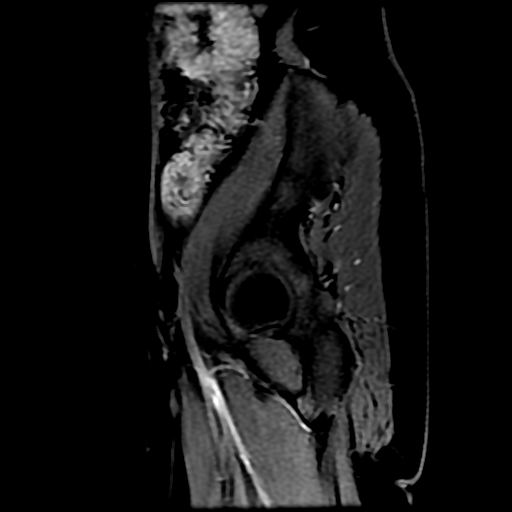
[im 84/84]
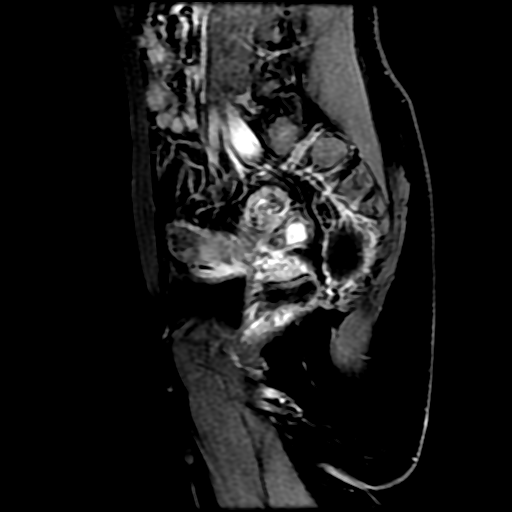

[Series 803: T1 dynamic · sagittal · 4.4mm · 0.55mm/px · 3 of 84 slices shown (4 of 4)]
[im 1/84]
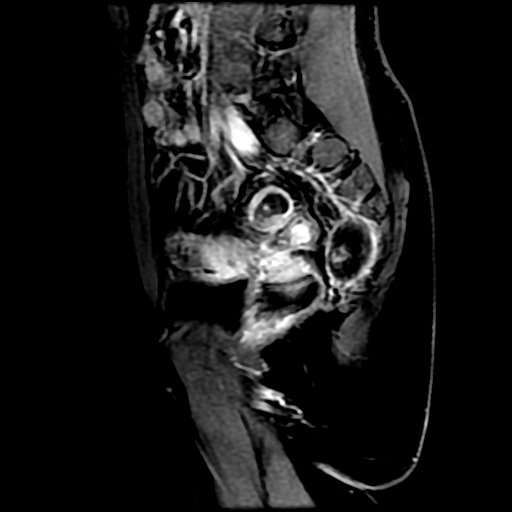
[im 42/84]
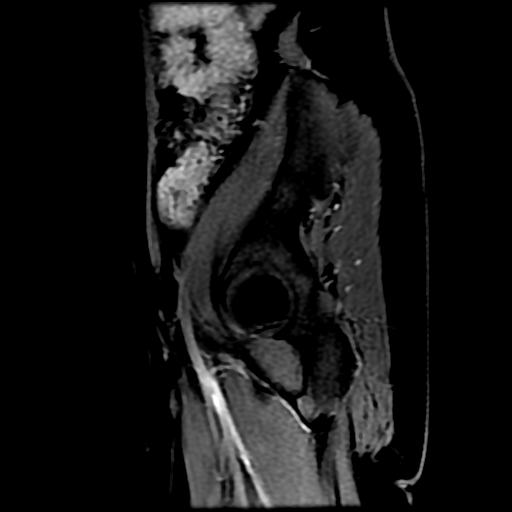
[im 84/84]
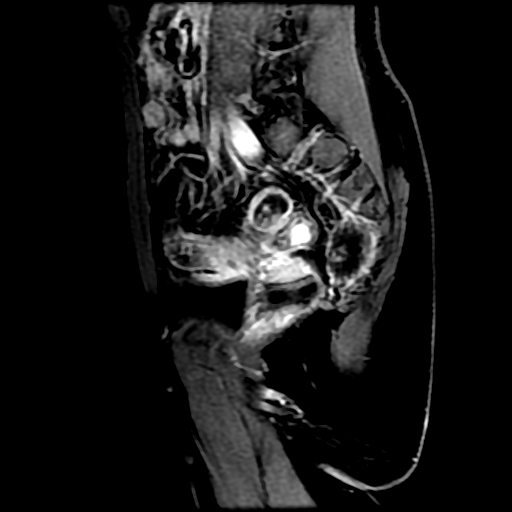

[((id)/801/33)-((id)/800/33) · sagittal · 4.4mm · 0.55mm/px · 2 of 84 slices shown]
[im 1/84]
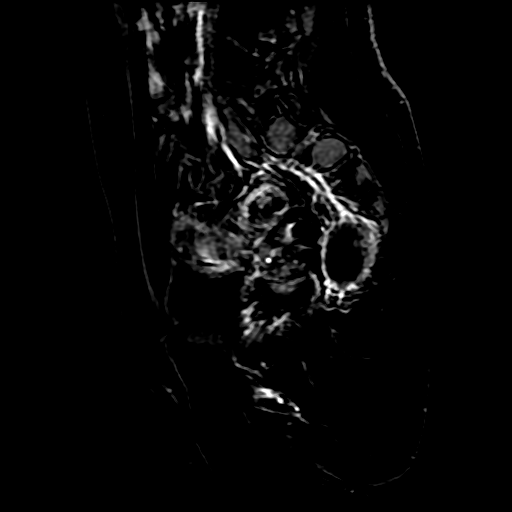
[im 42/84]
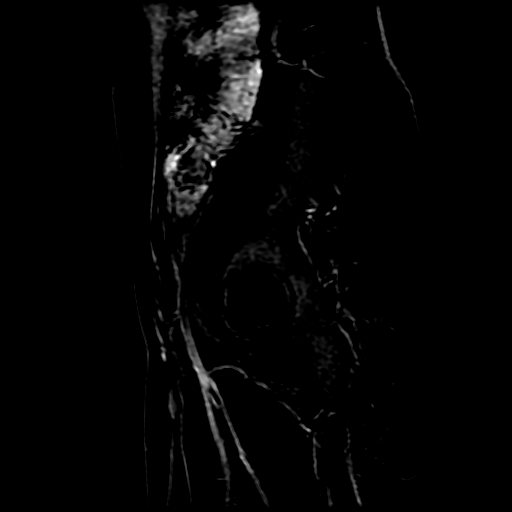

[21 of 48 positions shown; findings below may reference images not displayed]

TÉCNICA DE EXAME: foram obtidas imagens multiplanares e multissequenciais, antes e após a administração 
endovenosa de gadolínio.
Feita introdução de gel no interior da cavidade vaginal.
OS SEGUINTES ASPECTOS FORAM OBSERVADOS:
Formação tecidual amorfa, na projeção do tórus uterino / região retrocervical, de contornos irregulares, sem conteúdo 
hemático de permeio, numa extensão longitudinal estimada em 3,5 cm, associada a espessamento irregular do 
ligamento uterossacro direito.
Útero antevertido, de contornos regulares, medindo cerca de 7.5 x 4.4 x 3.7 cm (volume de 63,5 cm³); presença de DIU.
Endométrio e zona juncional miometrial sem alterações significativas.
Colo uterino de aspecto anatômico, com morfologia, dimensões e intensidade de sinal normais.
Cisto de conteúdo hemático no ovário esquerdo, que não exibe captação periférica do agente paramagnético, medindo 
cerca de 1,5 cm.
Pequena quantidade de líquido livre na escavação pélvica, sem significado patológico obrigatório.
Não foram observadas linfonodomegalias nas porções focalizadas.
RM PELVE 
Bexiga urinária com repleção parcial, sem sinais de espessamento de suas paredes
IMPRESSÃO DIAGNÓSTICA:
Formação tecidual amorfa, na projeção do tórus uterino / região retrocervical, sem conteúdo hemático de 
permeio, associada a espessamento irregular do ligamento uterossacro direito (foco de endometriose 
profunda?).
Cisto de conteúdo hemático no ovário esquerdo, que não exibe captação periférica do agente paramagnético. 
Considerar a possibilidade de endometrioma. 
  Se  você  não  for  destinatário,  saiba  que  qualquer  divulgação,  cópia,  distribuição  ou  utilização  do  conteúdo  dessas 
informações é proibido e passível de punição dentro da lei.

## 2022-09-08 IMAGING — MR e+1 RM - PE (ANTEPE) - NAO INCLUI TORNOZELO - DIREITO
4 of 7 series · 19 of 40 positions shown · non-contrast
Comparison: none

[Series 4: cor dp fat · coronal · 3.0mm · 0.33mm/px · 3 of 14 slices shown]
[im 1/14]
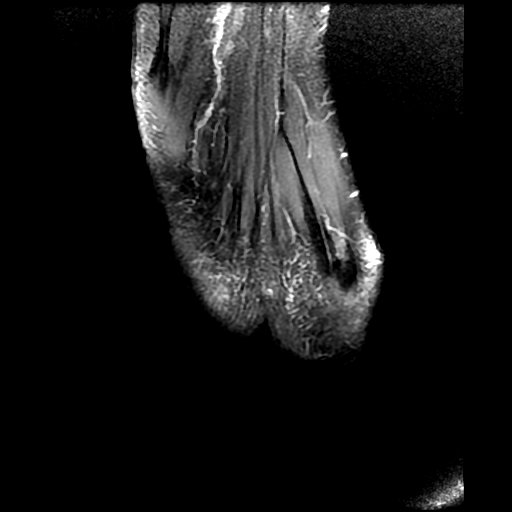
[im 9/14]
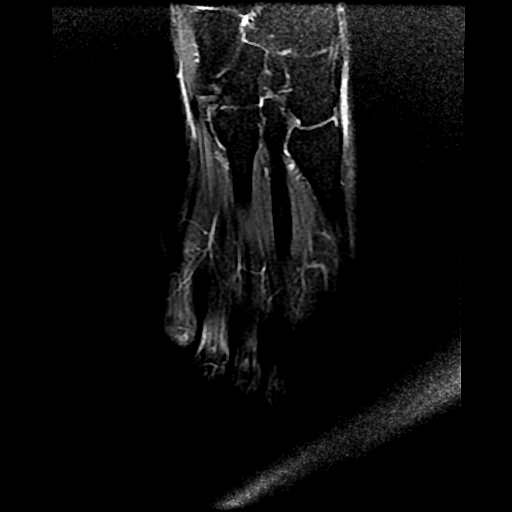
[im 14/14]
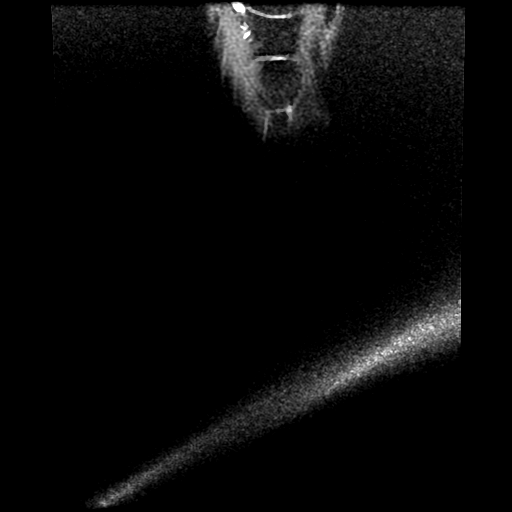

[Series 5: T1 · coronal · 3.0mm · 0.33mm/px · 4 of 14 slices shown (1 of 3)]
[im 1/14]
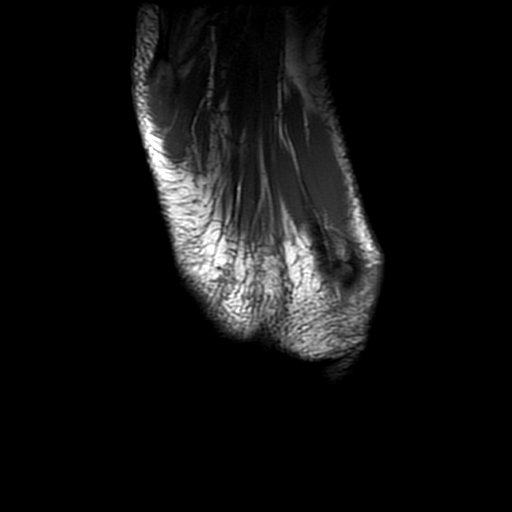
[im 5/14]
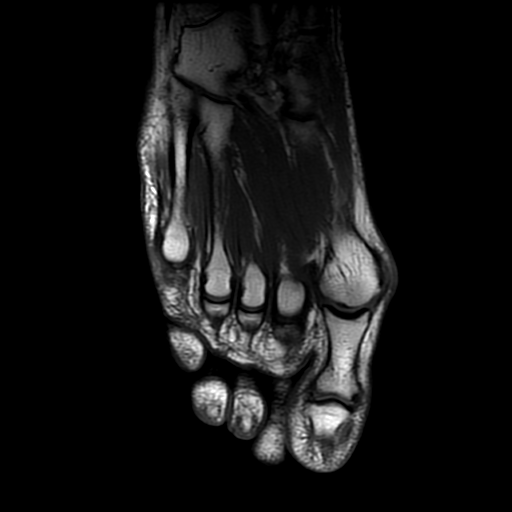
[im 9/14]
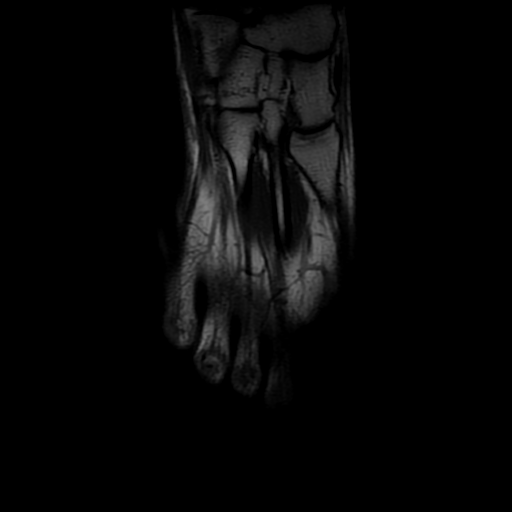
[im 14/14]
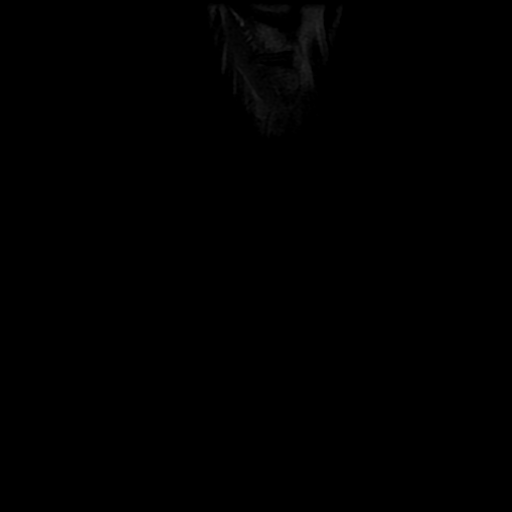

[Series 8: T1 · sagittal · 3.5mm · 0.33mm/px · 6 of 20 slices shown (2 of 3)]
[im 1/20]
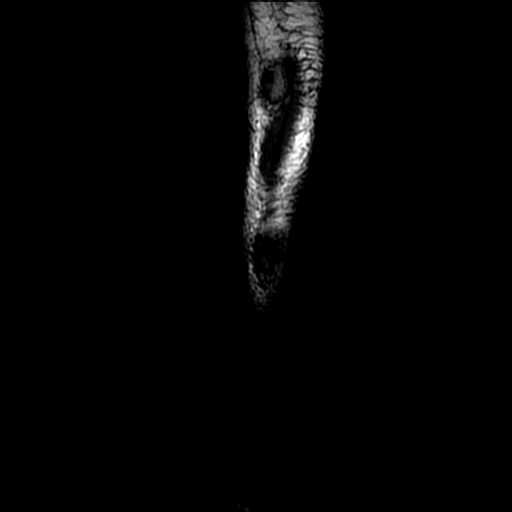
[im 4/20]
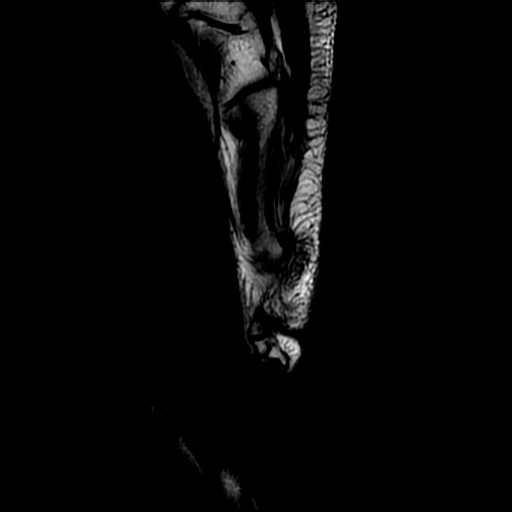
[im 8/20]
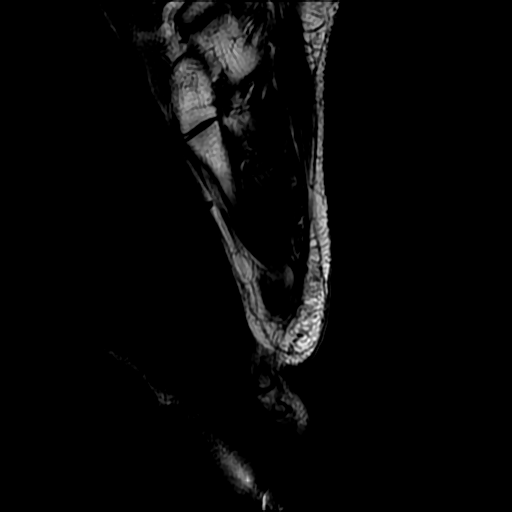
[im 12/20]
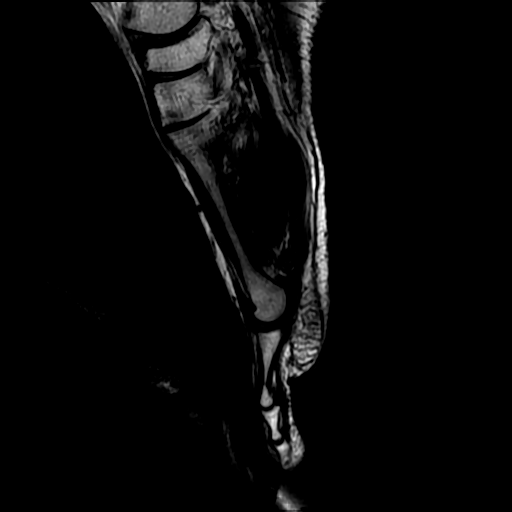
[im 16/20]
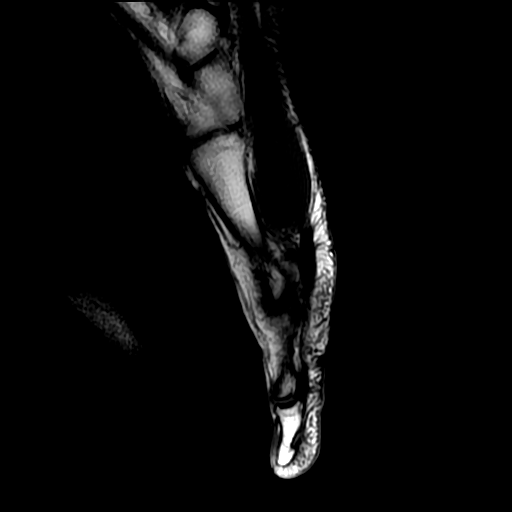
[im 20/20]
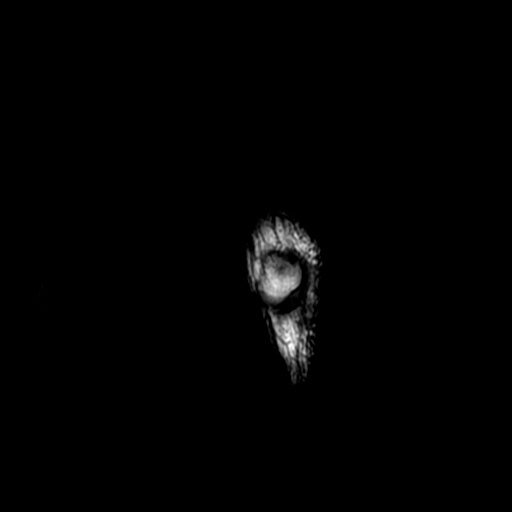

[Series 9: T1 · axial · 3.5mm · 0.31mm/px · z∈[-58,+23]mm · 6 of 30 slices shown (3 of 3)]
[im 1/30]
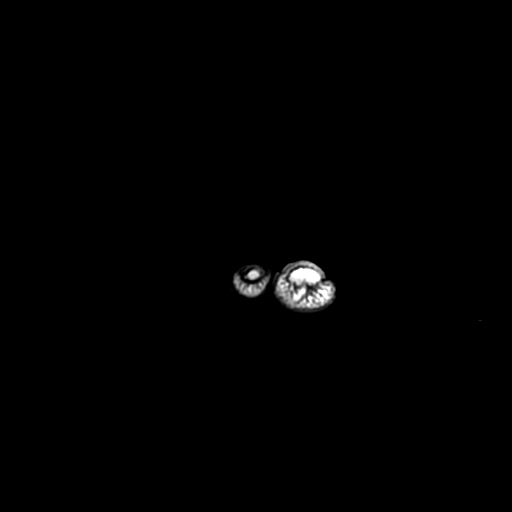
[im 5/30]
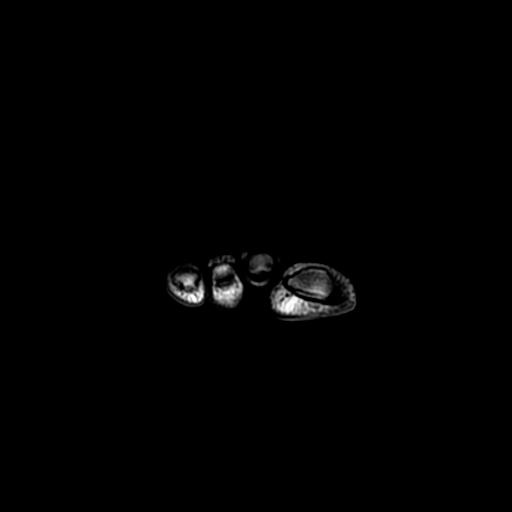
[im 9/30]
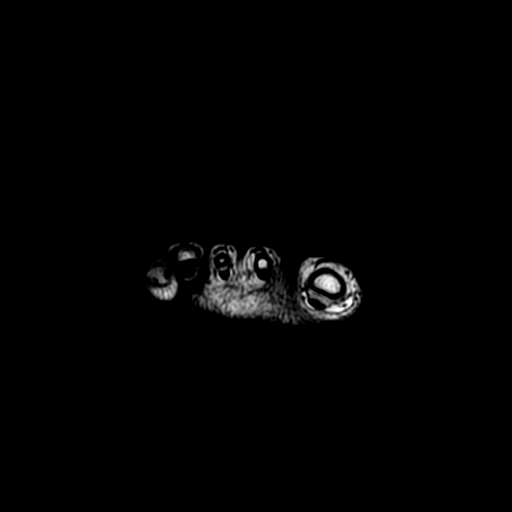
[im 13/30]
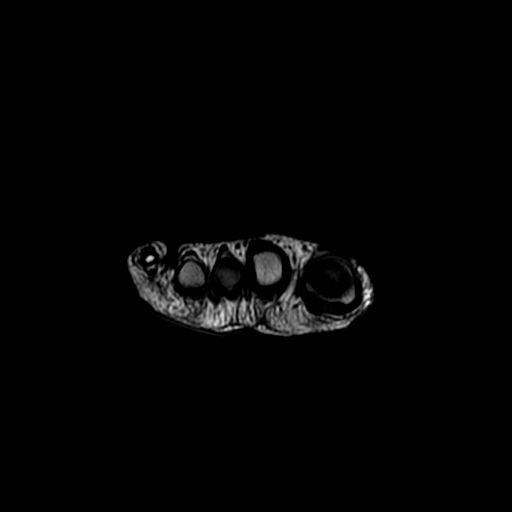
[im 17/30]
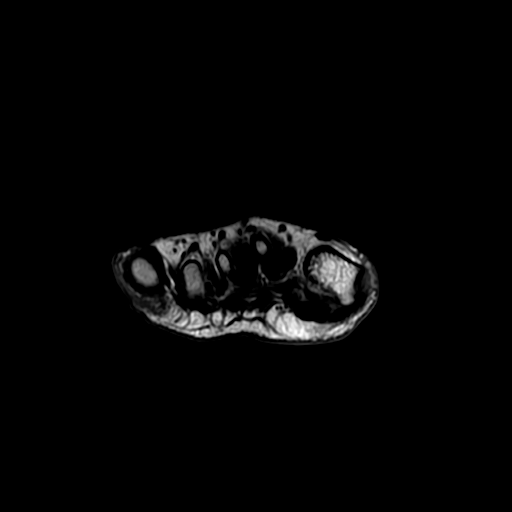
[im 25/30]
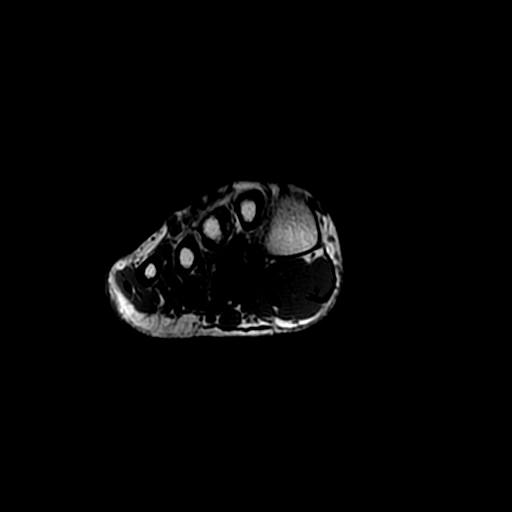

[19 of 40 positions shown; findings below may reference images not displayed]

Técnica:
Exame realizado com sequências ponderadas em T1 e T2 com supressão de gordura, sem infusão de solução
de contraste.
Relatório:
Hálux valgo associado a leve espessamento das estruturas capsuloligamentares mediais da articulação
metatarsofalângica e tênue edema medular ósseo na face medial da cabeça do metatarso.
RESSONÂNCIA MAGNÉTICA DO PÉ DIREITO
Leve edema do coxim adiposo plantar sob as cabeças dos metatarsos, pelo apoio mecânico.
Demais estruturas ósseas sem anormalidades.
Ausência de derrame ou sinovite articular.
Ligamentos colaterais metatarsofalângicos e interfalângicos íntegros.
Placas plantares com morfologia mantida e intensidade sinal habitual.
Espaços intermetatársicos livres. Não há formações nodulares sugestivas de neuroma.
Tendões e ventres musculares avaliados sem anormalidades.
Fáscia plantar com espessura mantida e sem alterações na região avaliada.
Impressão:
Hálux valgo.

## 2022-09-08 IMAGING — MR e+1 RM - PE (ANTEPE) - NAO INCLUI TORNOZELO - ESQUERDO
4 of 5 series · 19 of 40 positions shown · non-contrast
Comparison: none

[Series 4: cor dp fat · oblique · 3.0mm · 0.35mm/px · 3 of 16 slices shown]
[im 1/16]
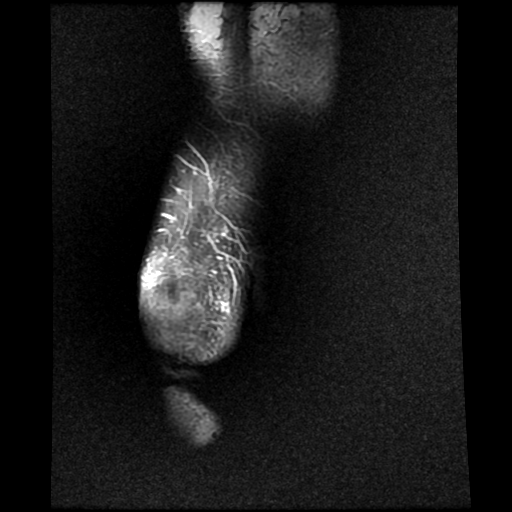
[im 8/16]
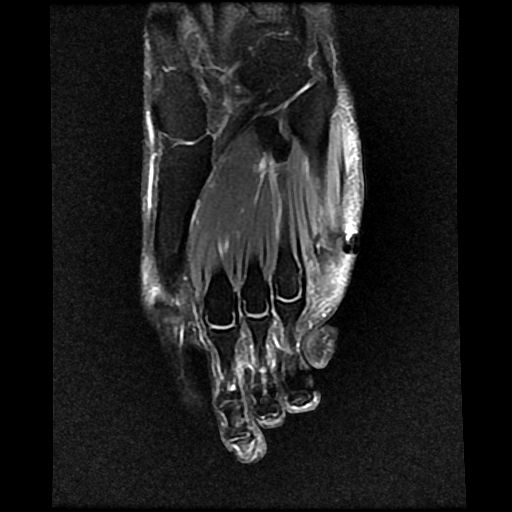
[im 16/16]
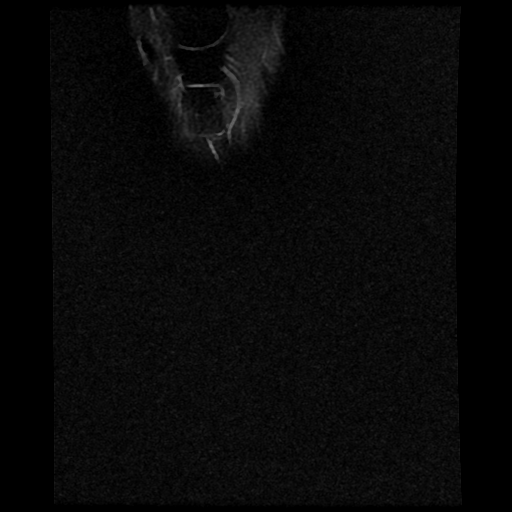

[Series 5: T1 · oblique · 3.0mm · 0.35mm/px · 6 of 16 slices shown (1 of 2)]
[im 1/16]
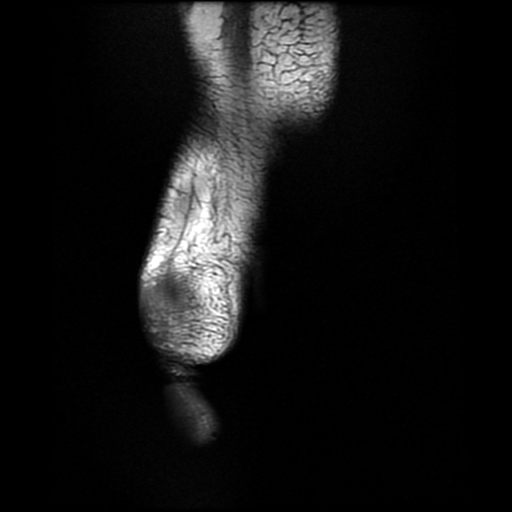
[im 4/16]
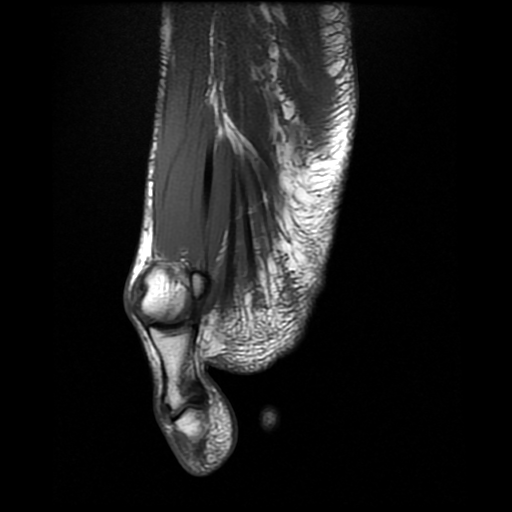
[im 7/16]
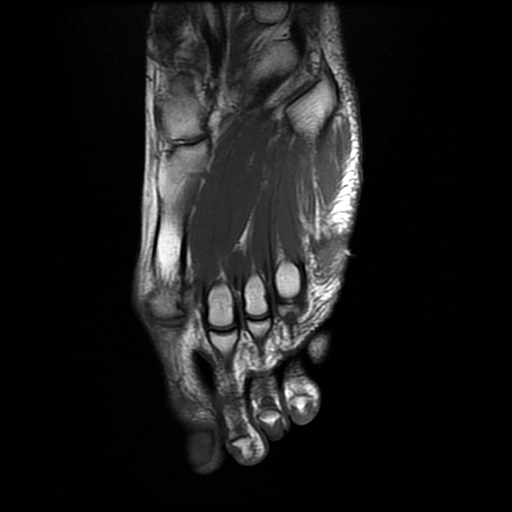
[im 10/16]
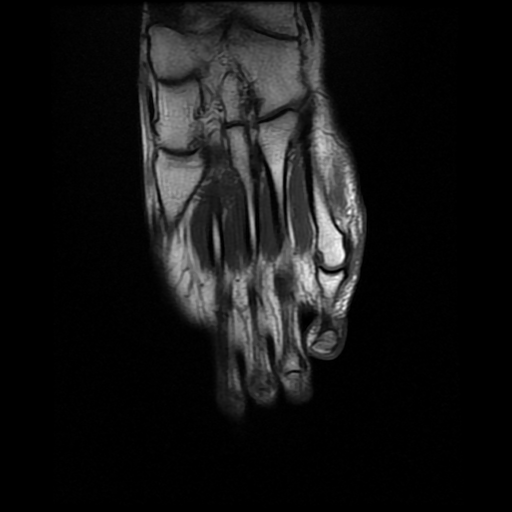
[im 13/16]
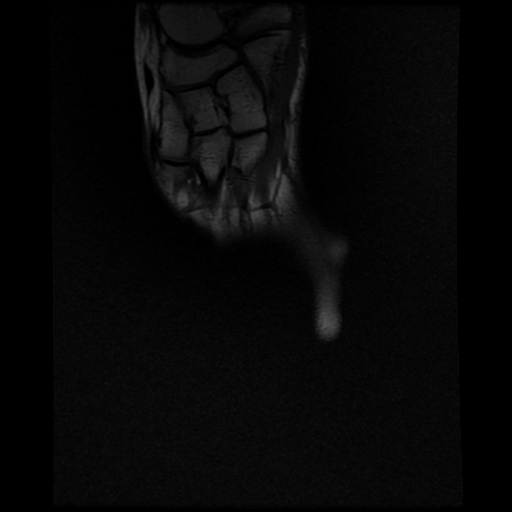
[im 16/16]
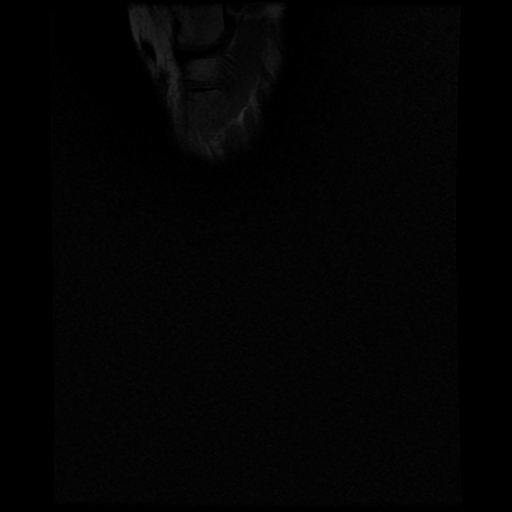

[Series 7: sag dp fat · sagittal · 3.7mm · 0.35mm/px · 3 of 20 slices shown]
[im 4/20]
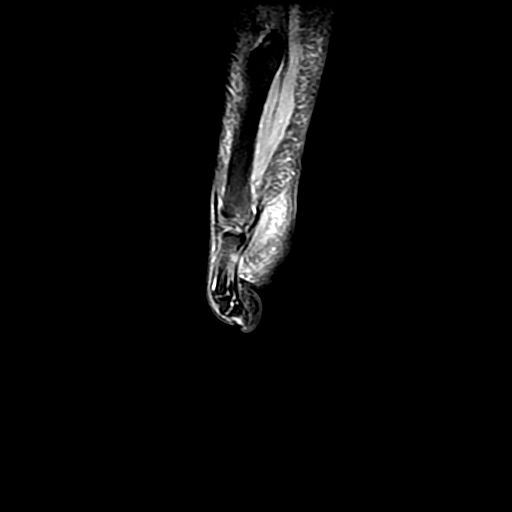
[im 10/20]
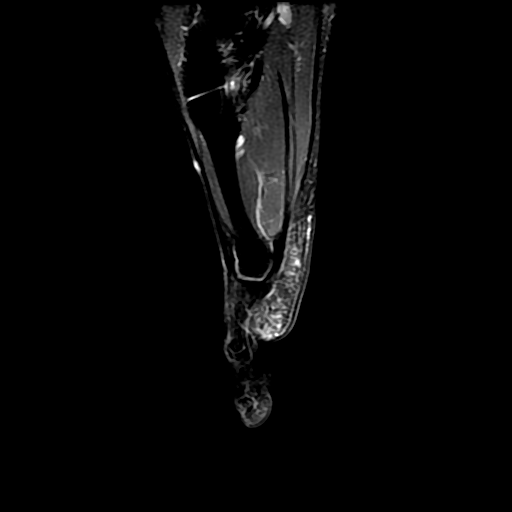
[im 16/20]
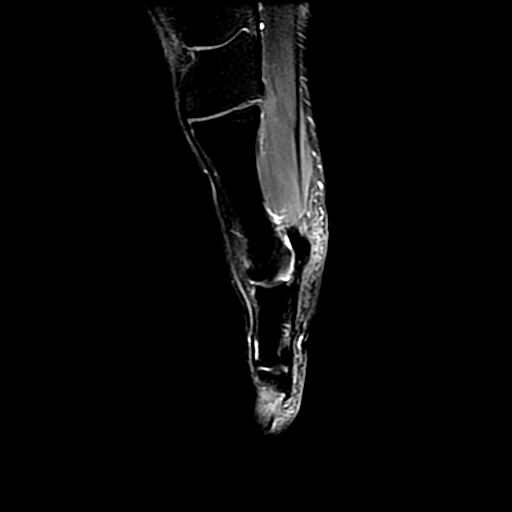

[Series 9: T1 · axial · 3.5mm · 0.31mm/px · z∈[-33,+52]mm · 7 of 30 slices shown (2 of 2)]
[im 1/30]
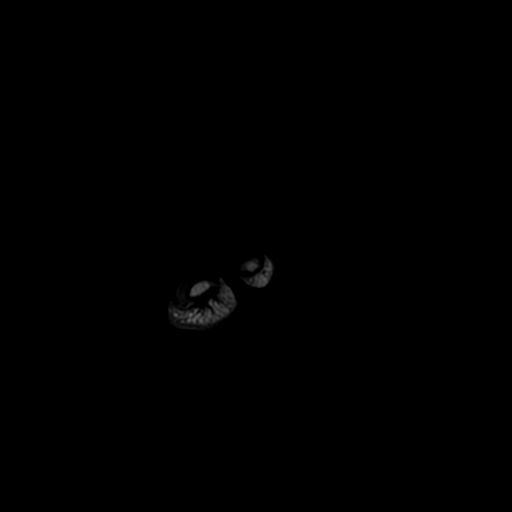
[im 3/30]
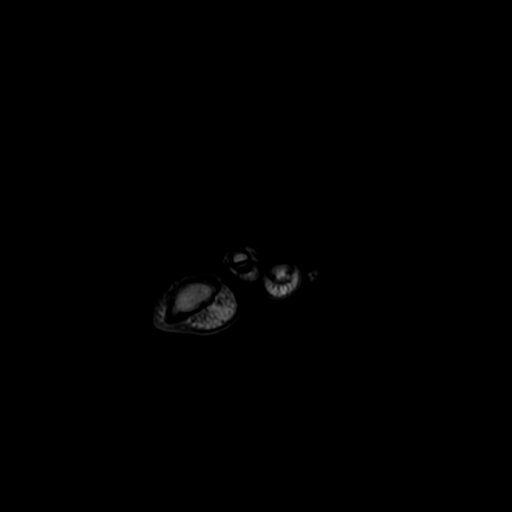
[im 6/30]
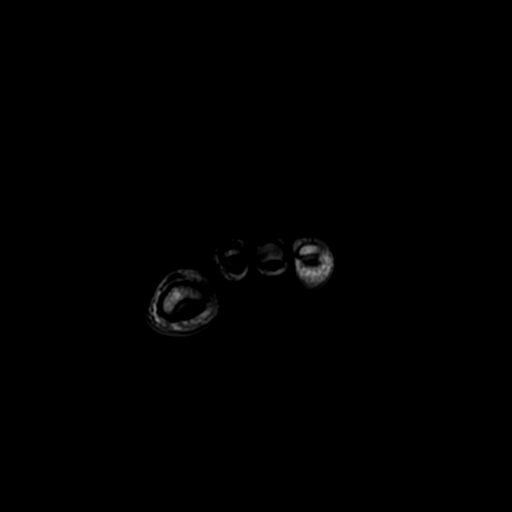
[im 9/30]
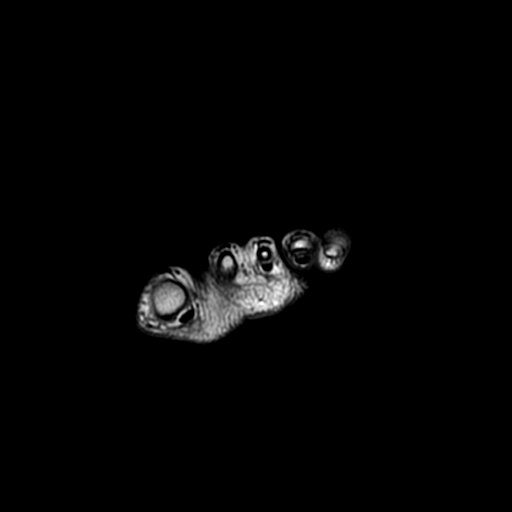
[im 12/30]
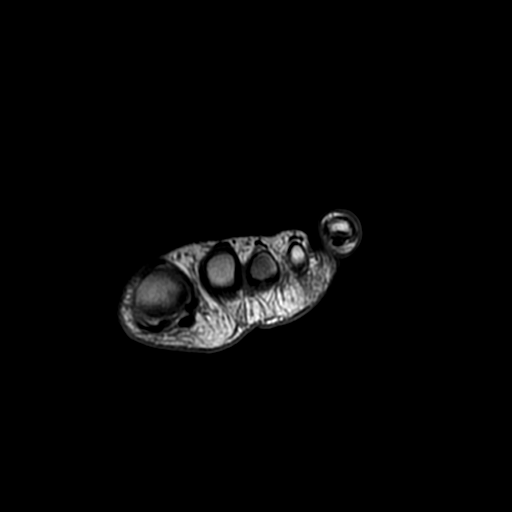
[im 15/30]
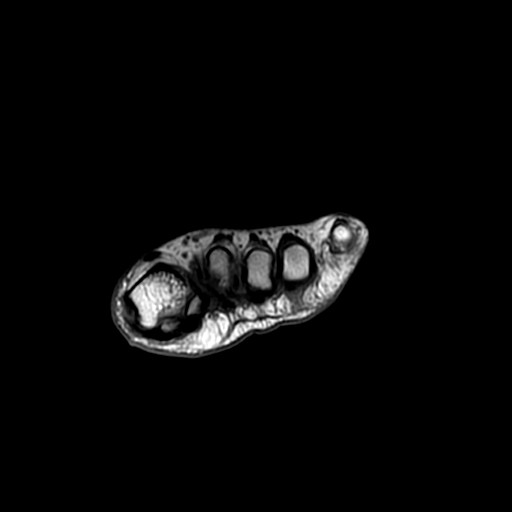
[im 27/30]
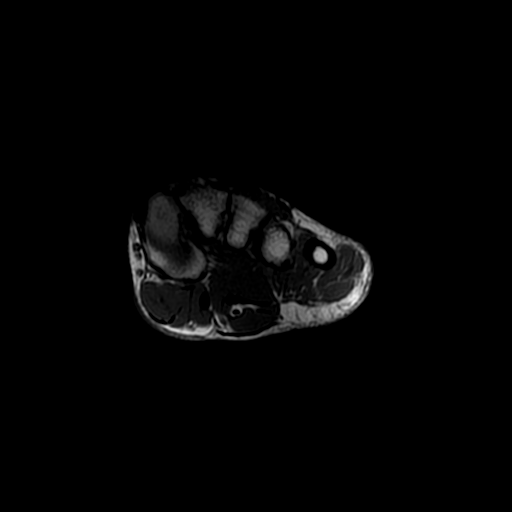

[19 of 40 positions shown; findings below may reference images not displayed]

Técnica:
Exame realizado com sequências ponderadas em T1 e T2 com supressão de gordura, sem infusão de solução
de contraste.
Relatório:
Hálux valgo associado a leve espessamento das estruturas capsuloligamentares mediais da articulação
RESSONÂNCIA MAGNÉTICA DO PÉ ESQUERDO
metatarsofalângica e tênue edema medular ósseo na face medial da cabeça do metatarso.
Leve edema do coxim adiposo plantar sob as cabeças dos metatarsos, pelo apoio mecânico.
Demais estruturas ósseas sem anormalidades.
Ausência de derrame ou sinovite articular.
Ligamentos colaterais metatarsofalângicos e interfalângicos íntegros.
Placas plantares com morfologia mantida e intensidade sinal habitual.
Espaços intermetatársicos livres. Não há formações nodulares sugestivas de neuroma.
Tendões e ventres musculares avaliados sem anormalidades.
Fáscia plantar com espessura mantida e sem alterações na região avaliada.
Impressão:
Hálux valgo.
Obs.: Os achados são semelhantes aos do lado contralateral.
